# Patient Record
Sex: Male | Born: 1965
Health system: Southern US, Community
[De-identification: ages and names within clinical notes are randomized; demographics above are authoritative.]

## PROBLEM LIST (undated history)

## (undated) DIAGNOSIS — C801 Malignant (primary) neoplasm, unspecified: Secondary | ICD-10-CM

## (undated) HISTORY — DX: Malignant (primary) neoplasm, unspecified: C80.1

---
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
## 2002-08-07 ENCOUNTER — Ambulatory Visit (HOSPITAL_COMMUNITY): Admission: RE | Admit: 2002-08-07 | Discharge: 2002-08-07 | Payer: Self-pay | Admitting: Neurology

## 2015-06-03 ENCOUNTER — Encounter (INDEPENDENT_AMBULATORY_CARE_PROVIDER_SITE_OTHER): Payer: Self-pay

## 2015-06-03 ENCOUNTER — Encounter: Payer: Self-pay | Admitting: Physician Assistant

## 2015-06-03 ENCOUNTER — Telehealth: Payer: Self-pay | Admitting: Family Medicine

## 2015-06-03 ENCOUNTER — Ambulatory Visit (INDEPENDENT_AMBULATORY_CARE_PROVIDER_SITE_OTHER): Payer: BLUE CROSS/BLUE SHIELD | Admitting: Physician Assistant

## 2015-06-03 VITALS — BP 130/84 | HR 92 | Temp 97.4°F | Ht 66.0 in | Wt 180.0 lb

## 2015-06-03 DIAGNOSIS — J069 Acute upper respiratory infection, unspecified: Secondary | ICD-10-CM | POA: Diagnosis not present

## 2015-06-03 MED ORDER — FLUTICASONE PROPIONATE 50 MCG/ACT NA SUSP: 2.0000 | Freq: Every day | NASAL | Status: DC

## 2015-06-03 MED ORDER — CETIRIZINE HCL 10 MG PO TABS: 10.0000 mg | ORAL_TABLET | Freq: Every day | ORAL | Status: DC

## 2015-06-03 MED ORDER — AMOXICILLIN 875 MG PO TABS: 875.0000 mg | ORAL_TABLET | Freq: Two times a day (BID) | ORAL | Status: DC

## 2015-06-03 NOTE — Progress Notes (Signed)
   Subjective:    Patient ID: Kenneth Greene, male    DOB: 03/22/1966, 49 y.o.   MRN: [id number]  HPI 49 y/o male presents with c/o headache, increased with bending forward. States that when he bends forward he smells a funny smell. He also has PND.    Review of Systems  Constitutional: Negative for fever, chills, diaphoresis and fatigue.  HENT: Positive for congestion (nasal ), postnasal drip, rhinorrhea, sinus pressure and sore throat. Negative for ear pain and sneezing.   Eyes: Negative.   Respiratory: Negative for cough, shortness of breath and wheezing.   Cardiovascular: Negative.   Gastrointestinal: Positive for nausea.  Endocrine: Negative.   Genitourinary: Negative.   Musculoskeletal: Negative.   Skin: Negative.   Allergic/Immunologic: Negative.   Neurological: Negative.        Objective:   Physical Exam  Constitutional: He is oriented to person, place, and time. He appears well-developed and well-nourished. No distress.  HENT:  Head: Normocephalic and atraumatic.  Right Ear: External ear normal.  Left Ear: External ear normal.  Mouth/Throat: No oropharyngeal exudate.  Posterior pharynx erythema bilaterally  ttp right maxillary sinus  Nasal turbinates erythematous and boggy bilateally   Pulmonary/Chest: Effort normal and breath sounds normal. No respiratory distress. He has no wheezes. He has no rales. He exhibits no tenderness.  Neurological: He is alert and oriented to person, place, and time.  Skin: He is not diaphoretic.  Psychiatric: He has a normal mood and affect. His behavior is normal. Judgment and thought content normal.  Nursing note and vitals reviewed.         Assessment & Plan:  1. Acute upper respiratory infection - Otc plain musinex - amoxicillin (AMOXIL) 875 MG tablet; Take 1 tablet (875 mg total) by mouth 2 (two) times daily.  Dispense: 20 tablet; Refill: 0 - fluticasone (FLONASE) 50 MCG/ACT nasal spray; Place 2 sprays into both nostrils  daily.  Dispense: 16 g; Refill: 6 - cetirizine (ZYRTEC) 10 MG tablet; Take 1 tablet (10 mg total) by mouth daily.  Dispense: 30 tablet; Refill: 11   RTC if s/s worsen or do not improve   Sherrill Mckamie A. Benjamin Stain PA-C

## 2015-06-03 NOTE — Telephone Encounter (Signed)
Patient has a sinus infection and has bcbs and he is not currently on any medications.  Appointment given for this evening at 6:15 and was aware to arrive at 5:45.

## 2015-06-03 NOTE — Patient Instructions (Signed)
Over the counter plain musinex     Upper Respiratory Infection, Adult An upper respiratory infection (URI) is also sometimes known as the common cold. The upper respiratory tract includes the nose, sinuses, throat, trachea, and bronchi. Bronchi are the airways leading to the lungs. Most people improve within 1 week, but symptoms can last up to 2 weeks. A residual cough may last even longer.  CAUSES Many different viruses can infect the tissues lining the upper respiratory tract. The tissues become irritated and inflamed and often become very moist. Mucus production is also common. A cold is contagious. You can easily spread the virus to others by oral contact. This includes kissing, sharing a glass, coughing, or sneezing. Touching your mouth or nose and then touching a surface, which is then touched by another person, can also spread the virus. SYMPTOMS  Symptoms typically develop 1 to 3 days after you come in contact with a cold virus. Symptoms vary from person to person. They may include:  Runny nose.  Sneezing.  Nasal congestion.  Sinus irritation.  Sore throat.  Loss of voice (laryngitis).  Cough.  Fatigue.  Muscle aches.  Loss of appetite.  Headache.  Low-grade fever. DIAGNOSIS  You might diagnose your own cold based on familiar symptoms, since most people get a cold 2 to 3 times a year. Your caregiver can confirm this based on your exam. Most importantly, your caregiver can check that your symptoms are not due to another disease such as strep throat, sinusitis, pneumonia, asthma, or epiglottitis. Blood tests, throat tests, and X-rays are not necessary to diagnose a common cold, but they may sometimes be helpful in excluding other more serious diseases. Your caregiver will decide if any further tests are required. RISKS AND COMPLICATIONS  You may be at risk for a more severe case of the common cold if you smoke cigarettes, have chronic heart disease (such as heart failure)  or lung disease (such as asthma), or if you have a weakened immune system. The very young and very old are also at risk for more serious infections. Bacterial sinusitis, middle ear infections, and bacterial pneumonia can complicate the common cold. The common cold can worsen asthma and chronic obstructive pulmonary disease (COPD). Sometimes, these complications can require emergency medical care and may be life-threatening. PREVENTION  The best way to protect against getting a cold is to practice good hygiene. Avoid oral or hand contact with people with cold symptoms. Wash your hands often if contact occurs. There is no clear evidence that vitamin C, vitamin E, echinacea, or exercise reduces the chance of developing a cold. However, it is always recommended to get plenty of rest and practice good nutrition. TREATMENT  Treatment is directed at relieving symptoms. There is no cure. Antibiotics are not effective, because the infection is caused by a virus, not by bacteria. Treatment may include:  Increased fluid intake. Sports drinks offer valuable electrolytes, sugars, and fluids.  Breathing heated mist or steam (vaporizer or shower).  Eating chicken soup or other clear broths, and maintaining good nutrition.  Getting plenty of rest.  Using gargles or lozenges for comfort.  Controlling fevers with ibuprofen or acetaminophen as directed by your caregiver.  Increasing usage of your inhaler if you have asthma. Zinc gel and zinc lozenges, taken in the first 24 hours of the common cold, can shorten the duration and lessen the severity of symptoms. Pain medicines may help with fever, muscle aches, and throat pain. A variety of non-prescription medicines are  available to treat congestion and runny nose. Your caregiver can make recommendations and may suggest nasal or lung inhalers for other symptoms.  HOME CARE INSTRUCTIONS   Only take over-the-counter or prescription medicines for pain, discomfort, or  fever as directed by your caregiver.  Use a warm mist humidifier or inhale steam from a shower to increase air moisture. This may keep secretions moist and make it easier to breathe.  Drink enough water and fluids to keep your urine clear or pale yellow.  Rest as needed.  Return to work when your temperature has returned to normal or as your caregiver advises. You may need to stay home longer to avoid infecting others. You can also use a face mask and careful hand washing to prevent spread of the virus. SEEK MEDICAL CARE IF:   After the first few days, you feel you are getting worse rather than better.  You need your caregiver's advice about medicines to control symptoms.  You develop chills, worsening shortness of breath, or brown or red sputum. These may be signs of pneumonia.  You develop yellow or brown nasal discharge or pain in the face, especially when you bend forward. These may be signs of sinusitis.  You develop a fever, swollen neck glands, pain with swallowing, or white areas in the back of your throat. These may be signs of strep throat. SEEK IMMEDIATE MEDICAL CARE IF:   You have a fever.  You develop severe or persistent headache, ear pain, sinus pain, or chest pain.  You develop wheezing, a prolonged cough, cough up blood, or have a change in your usual mucus (if you have chronic lung disease).  You develop sore muscles or a stiff neck. Document Released: 04/20/2001 Document Revised: 01/17/2012 Document Reviewed: 01/30/2014 [facility] Patient Information 2015 [place], [place]. This information is not intended to replace advice given to you by your health care provider. Make sure you discuss any questions you have with your health care provider.

## 2015-09-23 ENCOUNTER — Encounter: Payer: Self-pay | Admitting: Family Medicine

## 2015-09-23 ENCOUNTER — Ambulatory Visit (INDEPENDENT_AMBULATORY_CARE_PROVIDER_SITE_OTHER): Payer: BLUE CROSS/BLUE SHIELD | Admitting: Family Medicine

## 2015-09-23 VITALS — BP 128/80 | HR 88 | Temp 97.3°F | Ht 66.0 in | Wt 187.0 lb

## 2015-09-23 DIAGNOSIS — N521 Erectile dysfunction due to diseases classified elsewhere: Secondary | ICD-10-CM | POA: Diagnosis not present

## 2015-09-23 DIAGNOSIS — Z720 Tobacco use: Secondary | ICD-10-CM | POA: Diagnosis not present

## 2015-09-23 DIAGNOSIS — L03211 Cellulitis of face: Secondary | ICD-10-CM | POA: Diagnosis not present

## 2015-09-23 DIAGNOSIS — Z8547 Personal history of malignant neoplasm of testis: Secondary | ICD-10-CM | POA: Diagnosis not present

## 2015-09-23 MED ORDER — TADALAFIL 20 MG PO TABS: 10.0000 mg | ORAL_TABLET | ORAL | Status: DC | PRN

## 2015-09-23 MED ORDER — AMOXICILLIN-POT CLAVULANATE 875-125 MG PO TABS: 1.0000 | ORAL_TABLET | Freq: Two times a day (BID) | ORAL | Status: DC

## 2015-09-23 NOTE — Progress Notes (Signed)
Subjective:  Patient ID: Kenneth Greene, male    DOB: [birth year]  Age: 49 y.o. MRN: [id number]  CC: Facial Swelling   HPI Kenneth Greene presents for 3 days of left facial pain. he has had swelling as well. The pain is moderate. It is more painful to chew than usual. No fever chills or sweats. It has been consistently getting worse.  Patient also is concerned about erectile dysfunction. He has been taking Levitra in the past. He is interested in trying Cialis. He had testicular cancer at age 12. He's been cancer free for 30 some years. He lost a testicle surgically but he did not have to go through chemotherapy or radiation. He was unable to father children however. Sex drive is moderate. However he is a smoker and ingest he says that he'd rather give up sex than his cigarettes. In response I told him that he might get his wish.  History Kenneth Greene has a past medical history of Cancer ([place]).   He has past surgical history that includes Orchiectomy (N/A, 1983).   His family history includes Diabetes in his mother; Heart disease in his father.He reports that he has been smoking.  He does not have any smokeless tobacco history on file. He reports that he does not drink alcohol or use illicit drugs.  Current Outpatient Prescriptions on File Prior to Visit  Medication Sig Dispense Refill  . cetirizine (ZYRTEC) 10 MG tablet Take 1 tablet (10 mg total) by mouth daily. (Patient not taking: Reported on 09/23/2015) 30 tablet 11  . fluticasone (FLONASE) 50 MCG/ACT nasal spray Place 2 sprays into both nostrils daily. (Patient not taking: Reported on 09/23/2015) 16 g 6   No current facility-administered medications on file prior to visit.    ROS Review of Systems  Constitutional: Negative for fever, chills, activity change and appetite change.  HENT: Positive for congestion and sinus pressure. Negative for ear discharge, ear pain, hearing loss, nosebleeds, postnasal drip, rhinorrhea (:Left  maxillary region), sneezing and trouble swallowing.   Respiratory: Negative for chest tightness and shortness of breath.   Cardiovascular: Negative for chest pain and palpitations.  Genitourinary: Negative for dysuria, discharge, scrotal swelling, difficulty urinating and penile pain.  Skin: Negative for rash.    Objective:  BP 128/80 mmHg  Pulse 88  Temp(Src) 97.3 F (36.3 C) (Oral)  Ht 5\' 6"  (1.676 m)  Wt 187 lb (84.823 kg)  BMI 30.20 kg/m2  SpO2 97%  Physical Exam  Constitutional: He is oriented to person, place, and time. He appears well-developed and well-nourished.  HENT:  Head: Normocephalic and atraumatic.  Right Ear: Tympanic membrane and external ear normal. No decreased hearing is noted.  Left Ear: Tympanic membrane and external ear normal. No decreased hearing is noted.  Nose: Mucosal edema present. Right sinus exhibits no frontal sinus tenderness. Left sinus exhibits no frontal sinus tenderness.  Mouth/Throat: No oropharyngeal exudate or posterior oropharyngeal erythema.  Moderate left facial edema over the cheek and to the angle of the mandible. Exam of the oral mucosa shows a 3 mm ulcer with moderate edema above the left upper bicuspid and first molar. There is also a large amount of erythema extending over to the pupil mucosa  Neck: No Brudzinski's sign noted.  Pulmonary/Chest: Breath sounds normal. No respiratory distress.  Musculoskeletal: Normal range of motion.  Lymphadenopathy:       Head (right side): No preauricular adenopathy present.       Head (left side): No preauricular adenopathy  present.       Right cervical: No superficial cervical adenopathy present.      Left cervical: No superficial cervical adenopathy present.  Neurological: He is alert and oriented to person, place, and time.    Assessment & Plan:   Kenneth Greene was seen today for facial swelling.  Diagnoses and all orders for this visit:  Facial cellulitis  Erectile dysfunction due to  diseases classified elsewhere  Tobacco abuse  History of primary testicular cancer  Other orders -     tadalafil (CIALIS) 20 MG tablet; Take 0.5-1 tablets (10-20 mg total) by mouth every other day as needed for erectile dysfunction. -     amoxicillin-clavulanate (AUGMENTIN) 875-125 MG tablet; Take 1 tablet by mouth 2 (two) times daily. Take all of this medication   I have discontinued Kenneth Greene's amoxicillin. I am also having him start on tadalafil and amoxicillin-clavulanate. Additionally, I am having him maintain his fluticasone and cetirizine.  Meds ordered this encounter  Medications  . tadalafil (CIALIS) 20 MG tablet    Sig: Take 0.5-1 tablets (10-20 mg total) by mouth every other day as needed for erectile dysfunction.    Dispense:  5 tablet    Refill:  11  . amoxicillin-clavulanate (AUGMENTIN) 875-125 MG tablet    Sig: Take 1 tablet by mouth 2 (two) times daily. Take all of this medication    Dispense:  20 tablet    Refill:  0   Smoking cessation discussion lasting approximately 10 minutes. Also discussed itspotential relation to  erectile dysfunction  Follow-up: Return in about 2 weeks (around 10/07/2015) for CPE.  Claretta Fraise, M.D.

## 2015-10-07 ENCOUNTER — Encounter: Payer: Self-pay | Admitting: Family Medicine

## 2015-10-07 ENCOUNTER — Ambulatory Visit (INDEPENDENT_AMBULATORY_CARE_PROVIDER_SITE_OTHER): Payer: BLUE CROSS/BLUE SHIELD

## 2015-10-07 ENCOUNTER — Ambulatory Visit (INDEPENDENT_AMBULATORY_CARE_PROVIDER_SITE_OTHER): Payer: BLUE CROSS/BLUE SHIELD | Admitting: Family Medicine

## 2015-10-07 VITALS — BP 129/80 | HR 95 | Temp 97.0°F | Ht 66.0 in | Wt 189.0 lb

## 2015-10-07 DIAGNOSIS — R05 Cough: Secondary | ICD-10-CM | POA: Diagnosis not present

## 2015-10-07 DIAGNOSIS — Z0000 Encounter for general adult medical examination without abnormal findings: Secondary | ICD-10-CM | POA: Diagnosis not present

## 2015-10-07 DIAGNOSIS — Z720 Tobacco use: Secondary | ICD-10-CM

## 2015-10-07 DIAGNOSIS — Z125 Encounter for screening for malignant neoplasm of prostate: Secondary | ICD-10-CM

## 2015-10-07 DIAGNOSIS — Z0189 Encounter for other specified special examinations: Secondary | ICD-10-CM | POA: Diagnosis not present

## 2015-10-07 DIAGNOSIS — R053 Chronic cough: Secondary | ICD-10-CM

## 2015-10-07 NOTE — Patient Instructions (Signed)

## 2015-10-07 NOTE — Progress Notes (Signed)
Quick Note:  Your chest x-ray looked normal. Thanks, WS. ______ 

## 2015-10-07 NOTE — Progress Notes (Signed)
Subjective:  Patient ID: Kenneth Greene, male    DOB: 1966-03-01  Age: 49 y.o. MRN: [id number]  CC: Annual Exam   HPI SHAWNEE PETZOLD presents for CPE. Smoking 1 per day cigarettes for 33 years.  Cough onset 1 year.  Not exercising  History Kenneth Greene has a past medical history of Cancer ([place]).   Kenneth Greene has past surgical history that includes Orchiectomy (N/A, 1983).   His family history includes Diabetes in his mother; Heart disease in his father.Kenneth Greene reports that Kenneth Greene has been smoking.  Kenneth Greene does not have any smokeless tobacco history on file. Kenneth Greene reports that Kenneth Greene does not drink alcohol or use illicit drugs.  Outpatient Prescriptions Prior to Visit  Medication Sig Dispense Refill  . amoxicillin-clavulanate (AUGMENTIN) 875-125 MG tablet Take 1 tablet by mouth 2 (two) times daily. Take all of this medication 20 tablet 0  . cetirizine (ZYRTEC) 10 MG tablet Take 1 tablet (10 mg total) by mouth daily. (Patient not taking: Reported on 09/23/2015) 30 tablet 11  . fluticasone (FLONASE) 50 MCG/ACT nasal spray Place 2 sprays into both nostrils daily. (Patient not taking: Reported on 09/23/2015) 16 g 6  . tadalafil (CIALIS) 20 MG tablet Take 0.5-1 tablets (10-20 mg total) by mouth every other day as needed for erectile dysfunction. 5 tablet 11   No facility-administered medications prior to visit.    ROS Review of Systems  Constitutional: Negative for fever, chills, diaphoresis, activity change, appetite change, fatigue and unexpected weight change.  HENT: Negative for congestion, ear pain, hearing loss, postnasal drip, rhinorrhea, sore throat, tinnitus and trouble swallowing.   Eyes: Negative for photophobia, pain, discharge and redness.  Respiratory: Positive for cough. Negative for apnea, choking, chest tightness, shortness of breath, wheezing and stridor.   Cardiovascular: Negative for chest pain, palpitations and leg swelling.  Gastrointestinal: Negative for nausea, vomiting, abdominal pain,  diarrhea, constipation, blood in stool and abdominal distention.  Endocrine: Negative for cold intolerance, heat intolerance, polydipsia, polyphagia and polyuria.  Genitourinary: Negative for dysuria, urgency, frequency, hematuria, flank pain, enuresis, difficulty urinating and genital sores.  Musculoskeletal: Negative for joint swelling and arthralgias.  Skin: Negative for color change, rash and wound.  Allergic/Immunologic: Negative for immunocompromised state.  Neurological: Negative for dizziness, tremors, seizures, syncope, facial asymmetry, speech difficulty, weakness, light-headedness, numbness and headaches.  Hematological: Does not bruise/bleed easily.  Psychiatric/Behavioral: Negative for suicidal ideas, hallucinations, behavioral problems, confusion, sleep disturbance, dysphoric mood, decreased concentration and agitation. The patient is not nervous/anxious and is not hyperactive.     Objective:  BP 129/80 mmHg  Pulse 95  Temp(Src) 97 F (36.1 C) (Oral)  Ht 5\' 6"  (1.676 m)  Wt 189 lb (85.73 kg)  BMI 30.52 kg/m2  BP Readings from Last 3 Encounters:  10/07/15 129/80  09/23/15 128/80  06/03/15 130/84    Wt Readings from Last 3 Encounters:  10/07/15 189 lb (85.73 kg)  09/23/15 187 lb (84.823 kg)  06/03/15 180 lb (81.647 kg)     Physical Exam  Constitutional: Kenneth Greene is oriented to person, place, and time. Kenneth Greene appears well-developed and well-nourished.  HENT:  Head: Normocephalic and atraumatic.  Mouth/Throat: Oropharynx is clear and moist.  Eyes: EOM are normal. Pupils are equal, round, and reactive to light.  Neck: Normal range of motion. No tracheal deviation present. No thyromegaly present.  Cardiovascular: Normal rate, regular rhythm and normal heart sounds.  Exam reveals no gallop and no friction rub.   No murmur heard. Pulmonary/Chest: Breath sounds normal. Kenneth Greene has  no wheezes. Kenneth Greene has no rales.  Abdominal: Soft. Kenneth Greene exhibits no mass. There is no tenderness.    Genitourinary: Rectum normal, prostate normal and penis normal. No penile tenderness.  Musculoskeletal: Normal range of motion. Kenneth Greene exhibits no edema.  Neurological: Kenneth Greene is alert and oriented to person, place, and time. Kenneth Greene has normal reflexes. No cranial nerve deficit. Kenneth Greene exhibits normal muscle tone.  Skin: Skin is warm and dry.  Psychiatric: Kenneth Greene has a normal mood and affect.    No results found for: HGBA1C  No results found for: WBC, HGB, HCT, PLT, GLUCOSE, CHOL, TRIG, HDL, LDLDIRECT, LDLCALC, ALT, AST, NA, K, CL, CREATININE, BUN, CO2, TSH, PSA, INR, GLUF, HGBA1C, MICROALBUR  No results found.  Assessment & Plan:   Jaruis was seen today for annual exam.  Diagnoses and all orders for this visit:  Wellness examination  Tobacco abuse  Screening for prostate cancer  I have discontinued Mr. Kantner's fluticasone, cetirizine, tadalafil, and amoxicillin-clavulanate.  No orders of the defined types were placed in this encounter.     Follow-up: Return in about 1 year (around 10/06/2016) for CPE.  Claretta Fraise, M.D.

## 2015-10-08 LAB — CMP14+EGFR
ALBUMIN: 4.3 g/dL (ref 3.5–5.5)
ALT: 30 IU/L (ref 0–44)
AST: 17 IU/L (ref 0–40)
Albumin/Globulin Ratio: 1.8 (ref 1.1–2.5)
Alkaline Phosphatase: 94 IU/L (ref 39–117)
BUN/Creatinine Ratio: 16 (ref 9–20)
BUN: 14 mg/dL (ref 6–24)
Bilirubin Total: 0.3 mg/dL (ref 0.0–1.2)
CALCIUM: 9.3 mg/dL (ref 8.7–10.2)
CO2: 23 mmol/L (ref 18–29)
CREATININE: 0.85 mg/dL (ref 0.76–1.27)
Chloride: 102 mmol/L (ref 97–106)
GFR calc non Af Amer: 102 mL/min/{1.73_m2} (ref >59)
GFR, EST AFRICAN AMERICAN: 118 mL/min/{1.73_m2} (ref >59)
GLOBULIN, TOTAL: 2.4 g/dL (ref 1.5–4.5)
Glucose: 98 mg/dL (ref 65–99)
Potassium: 4.5 mmol/L (ref 3.5–5.2)
SODIUM: 141 mmol/L (ref 136–144)
TOTAL PROTEIN: 6.7 g/dL (ref 6.0–8.5)

## 2015-10-08 LAB — CBC WITH DIFFERENTIAL/PLATELET
BASOS: 1 %
Basophils Absolute: 0.1 10*3/uL (ref 0.0–0.2)
EOS (ABSOLUTE): 0.4 10*3/uL (ref 0.0–0.4)
EOS: 3 %
HEMATOCRIT: 45.8 % (ref 37.5–51.0)
HEMOGLOBIN: 15.9 g/dL (ref 12.6–17.7)
IMMATURE GRANULOCYTES: 0 %
Immature Grans (Abs): 0 10*3/uL (ref 0.0–0.1)
Lymphocytes Absolute: 2.6 10*3/uL (ref 0.7–3.1)
Lymphs: 20 %
MCH: 31.3 pg (ref 26.6–33.0)
MCHC: 34.7 g/dL (ref 31.5–35.7)
MCV: 90 fL (ref 79–97)
MONOCYTES: 8 %
Monocytes Absolute: 1 10*3/uL — ABNORMAL HIGH (ref 0.1–0.9)
NEUTROS PCT: 68 %
Neutrophils Absolute: 8.8 10*3/uL — ABNORMAL HIGH (ref 1.4–7.0)
Platelets: 297 10*3/uL (ref 150–379)
RBC: 5.08 x10E6/uL (ref 4.14–5.80)
RDW: 13.6 % (ref 12.3–15.4)
WBC: 12.9 10*3/uL — AB (ref 3.4–10.8)

## 2015-10-08 LAB — PSA TOTAL (REFLEX TO FREE): Prostate Specific Ag, Serum: 1.4 ng/mL (ref 0.0–4.0)

## 2015-10-08 LAB — NMR, LIPOPROFILE
CHOLESTEROL: 192 mg/dL (ref 100–199)
HDL Cholesterol by NMR: 33 mg/dL — ABNORMAL LOW (ref >39)
HDL PARTICLE NUMBER: 25.3 umol/L — AB (ref >=30.5)
LDL Particle Number: 1712 nmol/L — ABNORMAL HIGH (ref <1000)
LDL Size: 20.5 nm (ref >20.5)
LDL-C: 130 mg/dL — AB (ref 0–99)
LP-IR SCORE: 62 — AB (ref <=45)
Small LDL Particle Number: 754 nmol/L — ABNORMAL HIGH (ref <=527)
Triglycerides by NMR: 143 mg/dL (ref 0–149)

## 2015-10-08 LAB — VITAMIN D 25 HYDROXY (VIT D DEFICIENCY, FRACTURES): Vit D, 25-Hydroxy: 21 ng/mL — ABNORMAL LOW (ref 30.0–100.0)

## 2015-10-15 ENCOUNTER — Encounter: Payer: Self-pay | Admitting: Family Medicine

## 2017-08-09 ENCOUNTER — Emergency Department (HOSPITAL_COMMUNITY)
Admission: EM | Admit: 2017-08-09 | Discharge: 2017-08-09 | Disposition: A | Discharge status: Home / Self Care | Payer: BLUE CROSS/BLUE SHIELD | Attending: Emergency Medicine | Admitting: Emergency Medicine

## 2017-08-09 ENCOUNTER — Encounter (HOSPITAL_COMMUNITY): Payer: Self-pay | Admitting: *Deleted

## 2017-08-09 DIAGNOSIS — R109 Unspecified abdominal pain: Secondary | ICD-10-CM | POA: Diagnosis not present

## 2017-08-09 DIAGNOSIS — R3911 Hesitancy of micturition: Secondary | ICD-10-CM | POA: Diagnosis not present

## 2017-08-09 DIAGNOSIS — F17210 Nicotine dependence, cigarettes, uncomplicated: Secondary | ICD-10-CM | POA: Diagnosis not present

## 2017-08-09 LAB — I-STAT CHEM 8, ED
BUN: 18 mg/dL (ref 6–20)
CALCIUM ION: 1.21 mmol/L (ref 1.15–1.40)
CREATININE: 1.1 mg/dL (ref 0.61–1.24)
Chloride: 106 mmol/L (ref 101–111)
GLUCOSE: 125 mg/dL — AB (ref 65–99)
HCT: 43 % (ref 39.0–52.0)
HEMOGLOBIN: 14.6 g/dL (ref 13.0–17.0)
POTASSIUM: 3.7 mmol/L (ref 3.5–5.1)
Sodium: 143 mmol/L (ref 135–145)
TCO2: 25 mmol/L (ref 22–32)

## 2017-08-09 LAB — URINALYSIS, ROUTINE W REFLEX MICROSCOPIC
BACTERIA UA: NONE SEEN
Bilirubin Urine: NEGATIVE
GLUCOSE, UA: NEGATIVE mg/dL
KETONES UR: NEGATIVE mg/dL
LEUKOCYTES UA: NEGATIVE
NITRITE: NEGATIVE
PH: 7 (ref 5.0–8.0)
PROTEIN: NEGATIVE mg/dL
Specific Gravity, Urine: 1.013 (ref 1.005–1.030)
Squamous Epithelial / LPF: NONE SEEN

## 2017-08-09 MED ORDER — SODIUM CHLORIDE 0.9 % IV BOLUS (SEPSIS)
1000.0000 mL | Freq: Once | INTRAVENOUS | Status: AC
Start: 2017-08-09 — End: 2017-08-09
  Administered 2017-08-09: 1000 mL via INTRAVENOUS

## 2017-08-09 NOTE — ED Triage Notes (Signed)
Pt states left side flank pain radiating to the groin. on & off for a week . Pain got worse tonight.

## 2017-08-09 NOTE — ED Notes (Signed)
Pt stated he has not urinated since 1900 yesterday. Bladder scan done. appx 40 ml of urine showing in bladder.

## 2017-08-09 NOTE — ED Provider Notes (Signed)
[facility] DEPT Provider Note   CSN: [id number] Arrival date & time: 08/09/17  0320     History   Chief Complaint Chief Complaint  Patient presents with  . Flank Pain    HPI Kenneth Greene is a 51 y.o. male.  The history is provided by the patient.  Flank Pain  This is a new problem. The current episode started more than 2 days ago. The problem occurs daily. The problem has been rapidly worsening. Associated symptoms include abdominal pain. Pertinent negatives include no chest pain and no shortness of breath. Nothing aggravates the symptoms. Nothing relieves the symptoms.   Pt reports he has had left flank pain on/off for past week Tonight it became abruptly worse and radiated into groin He also reports nausea/vomiting He denies dysuria but reports decreased urine output   Past Medical History:  Diagnosis Date  . Cancer ([place])    TESTICULAR    There are no active problems to display for this patient.   Past Surgical History:  Procedure Laterality Date  . ORCHIECTOMY N/A 1983   unilateral       Home Medications    Prior to Admission medications   Not on File    Family History Family History  Problem Relation Age of Onset  . Diabetes Mother   . Heart disease Father     Social History Social History  Substance Use Topics  . Smoking status: Current Every Day Smoker    Types: Cigarettes  . Smokeless tobacco: Never Used  . Alcohol use No     Allergies   Patient has no known allergies.   Review of Systems Review of Systems  Constitutional: Negative for fever.  Respiratory: Negative for shortness of breath.   Cardiovascular: Negative for chest pain.  Gastrointestinal: Positive for abdominal pain.  Genitourinary: Positive for difficulty urinating and flank pain.  All other systems reviewed and are negative.    Physical Exam Updated Vital Signs BP (!) 143/88 (BP Location: Right Arm)   Pulse 90   Temp (!) 97.5 F (36.4 C) (Oral)    Resp 18   Ht 1.676 m (5\' 6" )   Wt 85.7 kg (189 lb)   SpO2 96%   BMI 30.51 kg/m   Physical Exam CONSTITUTIONAL: Well developed/well nourished HEAD: Normocephalic/atraumatic EYES: EOMI/PERRL ENMT: Mucous membranes moist NECK: supple no meningeal signs SPINE/BACK:entire spine nontender CV: S1/S2 noted, no murmurs/rubs/gallops noted LUNGS: Lungs are clear to auscultation bilaterally, no apparent distress ABDOMEN: soft, nontender, no rebound or guarding, bowel sounds noted throughout abdomen GU:no cva tenderness NEURO: Pt is awake/alert/appropriate, moves all extremitiesx4.  No facial droop.   EXTREMITIES: pulses normal/equal, full ROM SKIN: warm, color normal PSYCH: no abnormalities of mood noted, alert and oriented to situation   ED Treatments / Results  Labs (all labs ordered are listed, but only abnormal results are displayed) Labs Reviewed  URINALYSIS, ROUTINE W REFLEX MICROSCOPIC - Abnormal; Notable for the following:       Result Value   Hgb urine dipstick LARGE (*)    All other components within normal limits  I-STAT CHEM 8, ED - Abnormal; Notable for the following:    Glucose, Bld 125 (*)    All other components within normal limits    EKG  EKG Interpretation None       Radiology No results found.  Procedures Procedures (including critical care time)  Medications Ordered in ED Medications  sodium chloride 0.9 % bolus 1,000 mL (1,000 mLs Intravenous New Bag/Given  08/09/17 0359)     Initial Impression / Assessment and Plan / ED Course  I have reviewed the triage vital signs and the nursing notes.  Pertinent labs results that were available during my care of the patient were reviewed by me and considered in my medical decision making (see chart for details).    4:39 AM By the end of my evaluation, pt was improved He declined pain meds Labs pending at this time 5:48 AM Pt reports he is pain free No distress He is ambulatory Suspect this represents  passed ureteral stone given h/o flank pain and hematuria Will d/c home Will defer further imaging/workup at this time  We discussed strict ER return precautions   Final Clinical Impressions(s) / ED Diagnoses   Final diagnoses:  Flank pain    New Prescriptions New Prescriptions   No medications on file     Ripley Fraise, MD 08/09/17 [phone]

## 2017-08-09 NOTE — ED Notes (Signed)
Pt alert & oriented x4, stable gait. Patient given discharge instructions, paperwork & prescription(s). Patient  instructed to stop at the registration desk to finish any additional paperwork. Patient verbalized understanding. Pt left department w/ no further questions. 

## 2019-11-12 ENCOUNTER — Encounter: Payer: Self-pay | Admitting: Nurse Practitioner

## 2019-11-12 ENCOUNTER — Ambulatory Visit (INDEPENDENT_AMBULATORY_CARE_PROVIDER_SITE_OTHER): Payer: BC Managed Care – PPO | Admitting: Nurse Practitioner

## 2019-11-12 DIAGNOSIS — J0100 Acute maxillary sinusitis, unspecified: Secondary | ICD-10-CM

## 2019-11-12 MED ORDER — AMOXICILLIN-POT CLAVULANATE 875-125 MG PO TABS: 1.0000 | ORAL_TABLET | Freq: Two times a day (BID) | ORAL | 0 refills | Status: DC

## 2019-11-12 NOTE — Progress Notes (Signed)
Virtual Visit via telephone Note Due to COVID-19 pandemic this visit was conducted virtually. This visit type was conducted due to national recommendations for restrictions regarding the COVID-19 Pandemic (e.g. social distancing, sheltering in place) in an effort to limit this patient's exposure and mitigate transmission in our community. All issues noted in this document were discussed and addressed.  A physical exam was not performed with this format.  I connected with Kenneth Greene on 11/12/19 at 10:15 by telephone and verified that I am speaking with the correct person using two identifiers. Kenneth Greene is currently located at home and no one is currently with him during visit. The provider, Mary-Margaret Hassell Done, FNP is located in their office at time of visit.  I discussed the limitations, risks, security and privacy concerns of performing an evaluation and management service by telephone and the availability of in person appointments. I also discussed with the patient that there may be a patient responsible charge related to this service. The patient expressed understanding and agreed to proceed.   History and Present Illness:   Chief Complaint: Sinusitis   HPI Patient calls in today c/o running a fever since christmas. He has had congestion, headache and body aches. He is some better today. Slight congestion, temp last night was 101.  He was tested for covid at [facility] in [facility] and test results were negative.   Review of Systems  Constitutional: Negative for chills and fever.  HENT: Positive for congestion and sinus pain. Negative for sore throat.   Respiratory: Positive for cough (non productive).   Musculoskeletal: Negative.   Neurological: Positive for headaches.  Psychiatric/Behavioral: Negative.   All other systems reviewed and are negative.    Observations/Objective: Alert and oriented- answers all questions appropriately No distress Slight  cough  Assessment and Plan: Kenneth Greene in today with chief complaint of Sinusitis   1. Acute maxillary sinusitis, recurrence not specified 1. Take meds as prescribed 2. Use a cool mist humidifier especially during the winter months and when heat has been humid. 3. Use saline nose sprays frequently 4. Saline irrigations of the nose can be very helpful if done frequently.  * 4X daily for 1 week*  * Use of a nettie pot can be helpful with this. Follow directions with this* 5. Drink plenty of fluids 6. Keep thermostat turn down low 7.For any cough or congestion  Use plain Mucinex- regular strength or max strength is fine   * Children- consult with Pharmacist for dosing 8. For fever or aces or pains- take tylenol or ibuprofen appropriate for age and weight.  * for fevers greater than 101 orally you may alternate ibuprofen and tylenol every  3 hours.   Meds ordered this encounter  Medications  . amoxicillin-clavulanate (AUGMENTIN) 875-125 MG tablet    Sig: Take 1 tablet by mouth 2 (two) times daily.    Dispense:  14 tablet    Refill:  0    Order Specific Question:   Supervising Provider    Answer:   Caryl Pina A [id number]      Follow Up Instructions: prn    I discussed the assessment and treatment plan with the patient. The patient was provided an opportunity to ask questions and all were answered. The patient agreed with the plan and demonstrated an understanding of the instructions.   The patient was advised to call back or seek an in-person evaluation if the symptoms worsen or if the condition fails to improve  as anticipated.  The above assessment and management plan was discussed with the patient. The patient verbalized understanding of and has agreed to the management plan. Patient is aware to call the clinic if symptoms persist or worsen. Patient is aware when to return to the clinic for a follow-up visit. Patient educated on when it is appropriate to go to the  emergency department.   Time call ended:  10:27  I provided 12 minutes of non-face-to-face time during this encounter.    Mary-Margaret Hassell Done, FNP

## 2019-11-16 ENCOUNTER — Ambulatory Visit: Payer: BC Managed Care – PPO | Attending: Internal Medicine

## 2019-11-16 ENCOUNTER — Other Ambulatory Visit: Payer: Self-pay

## 2019-11-16 DIAGNOSIS — Z20822 Contact with and (suspected) exposure to covid-19: Secondary | ICD-10-CM | POA: Diagnosis not present

## 2019-11-17 LAB — NOVEL CORONAVIRUS, NAA: SARS-CoV-2, NAA: NOT DETECTED

## 2020-04-23 ENCOUNTER — Encounter: Payer: Self-pay | Admitting: Physician Assistant

## 2020-04-23 ENCOUNTER — Ambulatory Visit: Payer: BC Managed Care – PPO | Admitting: Physician Assistant

## 2020-04-23 ENCOUNTER — Other Ambulatory Visit: Payer: Self-pay

## 2020-04-23 VITALS — BP 130/86 | HR 94 | Temp 98.1°F | Resp 20 | Ht 66.0 in | Wt 180.2 lb

## 2020-04-23 DIAGNOSIS — F419 Anxiety disorder, unspecified: Secondary | ICD-10-CM | POA: Diagnosis not present

## 2020-04-23 MED ORDER — SERTRALINE HCL 50 MG PO TABS: 50.0000 mg | ORAL_TABLET | Freq: Every day | ORAL | 0 refills | Status: DC

## 2020-04-23 NOTE — Progress Notes (Signed)
  Subjective:     Patient ID: Kenneth Greene, male   DOB: 05/28/1966, 54 y.o.   MRN: [id number]  HPI Pt with hx of anxiety/panic attacks Sx started several years ago following the loss of his mother He noticed he became more anxious and having mood swing with crying spells Then over the last several months he has had increasing trouble sleeping due to mind racing This has been accompanied by panic attacks with will happen intermit with up to 2-3 per day Sx sl worse since son left to go to [place] No FH of known anxiety/depression No prev tx  Review of Systems  Constitutional: Negative.   Psychiatric/Behavioral: Positive for decreased concentration and sleep disturbance. Negative for agitation, behavioral problems, confusion, dysphoric mood, hallucinations, self-injury and suicidal ideas. The patient is nervous/anxious. The patient is not hyperactive.        Objective:   Physical Exam Vitals and nursing note reviewed.  Psychiatric:        Attention and Perception: Attention normal.        Mood and Affect: Mood is anxious. Affect is tearful.        Speech: Speech normal.        Behavior: Behavior is cooperative.        Thought Content: Thought content normal.        Judgment: Judgment normal.        Assessment:     1. Anxiety        Plan:     Discussed sx with pt Reviewed good sleep habits Start Zoloft 50mg  q d- 1/2 tab for the first 8 days All SE reviewed Pt aware med will take time to work Pt with good support structure at home with wife F/U in 3 weeks sooner if any problems

## 2020-04-23 NOTE — Patient Instructions (Signed)

## 2020-05-20 ENCOUNTER — Ambulatory Visit: Payer: BC Managed Care – PPO | Admitting: Nurse Practitioner

## 2020-05-20 ENCOUNTER — Other Ambulatory Visit: Payer: Self-pay

## 2020-05-20 ENCOUNTER — Encounter: Payer: Self-pay | Admitting: Nurse Practitioner

## 2020-05-20 VITALS — BP 142/91 | HR 75 | Temp 98.6°F | Resp 20 | Ht 66.0 in | Wt 181.0 lb

## 2020-05-20 DIAGNOSIS — F5101 Primary insomnia: Secondary | ICD-10-CM

## 2020-05-20 DIAGNOSIS — F419 Anxiety disorder, unspecified: Secondary | ICD-10-CM

## 2020-05-20 MED ORDER — SERTRALINE HCL 100 MG PO TABS: 100.0000 mg | ORAL_TABLET | Freq: Every day | ORAL | 5 refills | Status: DC

## 2020-05-20 MED ORDER — TRAZODONE HCL 50 MG PO TABS: 25.0000 mg | ORAL_TABLET | Freq: Every evening | ORAL | 3 refills | Status: DC | PRN

## 2020-05-20 NOTE — Patient Instructions (Signed)

## 2020-05-20 NOTE — Progress Notes (Signed)
   Subjective:    Patient ID: Kenneth Greene, male    DOB: [birth year], 54 y.o.   MRN: [id number]   Chief Complaint: Recheck anxiety   HPI Patient was seen by B. [place] , [place] on 04/23/20 c/o anxiety. At that time he was having trouble sleeping and having panic attacks. He was started on zoloft 50mg . He says that has helped some with his panic attacks and anxiety. He says he is still easily irritated. He is still having trouble sleeping at night. Once he falls asleep he is okay. GAD 7 : Generalized Anxiety Score 05/20/2020 04/23/2020  Nervous, Anxious, on Edge 1 1  Control/stop worrying 1 1  Worry too much - different things 1 1  Trouble relaxing 1 3  Restless 0 0  Easily annoyed or irritable 1 2  Afraid - awful might happen 0 2  Total GAD 7 Score 5 10  Anxiety Difficulty Not difficult at all Extremely difficult        Review of Systems  Constitutional: Negative for diaphoresis.  Eyes: Negative for pain.  Respiratory: Negative for shortness of breath.   Cardiovascular: Negative for chest pain, palpitations and leg swelling.  Gastrointestinal: Negative for abdominal pain.  Endocrine: Negative for polydipsia.  Skin: Negative for rash.  Neurological: Negative for dizziness, weakness and headaches.  Hematological: Does not bruise/bleed easily.  All other systems reviewed and are negative.      Objective:   Physical Exam Vitals and nursing note reviewed.  Constitutional:      Appearance: Normal appearance.  Cardiovascular:     Rate and Rhythm: Normal rate and regular rhythm.     Heart sounds: Normal heart sounds.  Pulmonary:     Breath sounds: Normal breath sounds.  Skin:    General: Skin is warm.  Neurological:     General: No focal deficit present.     Mental Status: He is alert and oriented to person, place, and time.  Psychiatric:        Mood and Affect: Mood normal.        Behavior: Behavior normal.    BP (!) 142/91   Pulse 75   Temp 98.6 F (37 C)  (Temporal)   Resp 20   Ht 5\' 6"  (1.676 m)   Wt 181 lb (82.1 kg)   SpO2 94%   BMI 29.21 kg/m        Assessment & Plan:  [name] in today with chief complaint of Recheck anxiety   1. Anxiety Increased zoloft to 100mg  daily Side effects reviewed - sertraline (ZOLOFT) 100 MG tablet; Take 1 tablet (100 mg total) by mouth daily.  Dispense: 30 tablet; Refill: 5  2. Primary insomnia Bedtime routine Added trazadone- take 1 hour prior to bedtime. - traZODone (DESYREL) 50 MG tablet; Take 0.5-1 tablets (25-50 mg total) by mouth at bedtime as needed for sleep.  Dispense: 30 tablet; Refill: 3  Schedule CPE  The above assessment and management plan was discussed with the patient. The patient verbalized understanding of and has agreed to the management plan. Patient is aware to call the clinic if symptoms persist or worsen. Patient is aware when to return to the clinic for a follow-up visit. Patient educated on when it is appropriate to go to the emergency department.   Mary-Margaret Hassell Done, FNP

## 2020-06-17 ENCOUNTER — Ambulatory Visit: Payer: Self-pay | Admitting: Nurse Practitioner

## 2020-11-08 HISTORY — PX: OTHER SURGICAL HISTORY: SHX169

## 2021-01-19 DIAGNOSIS — W268XXA Contact with other sharp object(s), not elsewhere classified, initial encounter: Secondary | ICD-10-CM | POA: Diagnosis not present

## 2021-01-19 DIAGNOSIS — S56322A Laceration of extensor or abductor muscles, fascia and tendons of left thumb at forearm level, initial encounter: Secondary | ICD-10-CM | POA: Diagnosis not present

## 2021-01-19 DIAGNOSIS — Y990 Civilian activity done for income or pay: Secondary | ICD-10-CM | POA: Diagnosis not present

## 2021-01-19 DIAGNOSIS — S56302A Unspecified injury of extensor or abductor muscles, fascia and tendons of left thumb at forearm level, initial encounter: Secondary | ICD-10-CM | POA: Diagnosis not present

## 2021-01-19 DIAGNOSIS — F17200 Nicotine dependence, unspecified, uncomplicated: Secondary | ICD-10-CM | POA: Diagnosis not present

## 2021-01-19 DIAGNOSIS — S61512A Laceration without foreign body of left wrist, initial encounter: Secondary | ICD-10-CM | POA: Diagnosis not present

## 2021-01-21 ENCOUNTER — Ambulatory Visit (INDEPENDENT_AMBULATORY_CARE_PROVIDER_SITE_OTHER): Payer: Worker's Compensation | Admitting: Plastic Surgery

## 2021-01-21 ENCOUNTER — Other Ambulatory Visit: Payer: Self-pay

## 2021-01-21 ENCOUNTER — Encounter: Payer: Self-pay | Admitting: Plastic Surgery

## 2021-01-21 VITALS — BP 138/85 | HR 74 | Ht 66.0 in | Wt 176.0 lb

## 2021-01-21 DIAGNOSIS — S66822A Laceration of other specified muscles, fascia and tendons at wrist and hand level, left hand, initial encounter: Secondary | ICD-10-CM

## 2021-01-21 DIAGNOSIS — S61402A Unspecified open wound of left hand, initial encounter: Secondary | ICD-10-CM | POA: Diagnosis not present

## 2021-01-21 NOTE — Progress Notes (Signed)
Referring Provider Che[address] Pretty, [address],  [place] [address]   CC:  Chief Complaint  Patient presents with  . Procedure      Kenneth Greene is an 55 y.o. male.  HPI: Patient presents for an injury to his left thumb.  This occurred Monday.  He was a utility knife that accidentally slipped and cut him on the radial aspect of his left thumb near the base.  He was seen in the emergency room in [facility] and they suspected a partial injury to his APL tendon and sent to me.  No Known Allergies  Outpatient Encounter Medications as of 01/21/2021  Medication Sig  . sertraline (ZOLOFT) 100 MG tablet Take 1 tablet (100 mg total) by mouth daily. (Patient not taking: Reported on 01/21/2021)  . traZODone (DESYREL) 50 MG tablet Take 0.5-1 tablets (25-50 mg total) by mouth at bedtime as needed for sleep. (Patient not taking: Reported on 01/21/2021)   No facility-administered encounter medications on file as of 01/21/2021.     Past Medical History:  Diagnosis Date  . Cancer ([place])    TESTICULAR    Past Surgical History:  Procedure Laterality Date  . ORCHIECTOMY N/A 1983   unilateral    Family History  Problem Relation Age of Onset  . Diabetes Mother   . Heart disease Father     Social History   Social History Narrative  . Not on file     Review of Systems General: Denies fevers, chills, weight loss CV: Denies chest pain, shortness of breath, palpitations  Physical Exam Vitals with BMI 01/21/2021 05/20/2020 04/23/2020  Height 5\' 6"  5\' 6"  5\' 6"   Weight 176 lbs 181 lbs 180 lbs 4 oz  BMI 28.42 28.31 51.76  Systolic 160 737 106  Diastolic 85 91 86  Pulse 74 75 94    General:  No acute distress,  Alert and oriented, Non-Toxic, Normal speech and affect Left hand: Fingers well-perfused with normal capillary refill to palp radial pulse.  Sensation is intact throughout.  He has full range of motion.  He has an oblique laceration over the base of  his left thumb that has been stapled closed.  There is no signs of infection or wound drainage.  He specifically shows good function of his EPL and FPL.  APL is also firing with normal thumb range of motion.  Assessment/Plan Patient presents with a laceration to the base of his thumb.  Based on the picture that he took in the report I suspect a partial injury to the APL.  We discussed exploration and repair if necessary.  We discussed the risks include bleeding, infection, damage surrounding structures and need for additional procedures.  All of his questions were answered and we will plan to move forward  Surgery performed:  1.  Repair of left thumb abductor pollicis longus tendon at the level of the wrist 2.  Complex repair of left thumb laceration totaling 4 cm in length Preoperative diagnosis: Partial transection of left thumb APL tendon Postoperative diagnosis: Same Surgery in detail: Patient was identified preoperatively.  Informed symptoms obtained.  There is anesthetized 1% lidocaine with epinephrine.  The sutures and staples were removed.  The area was then prepped and draped sterilely.  I opened his incision and dissected down to the first dorsal extensor compartment.  There was a rent in the sheath and a clear partial transection of the APL tendon.  This was approximately 50% transection.  I released  the first dorsal extensor compartment sheath to free up any tethering and explored all surrounding tendons.  The extensor pollicis brevis appeared to be intact.  The APL tendon was then repaired with 2 figure-of-eight 4-0 nylon sutures.  I took his thumb through range of motion and there was no gapping.  I then debrided the edges of the laceration to ensure an acute wound and performed a layered closure with interrupted buried 4-0 chromic sutures and interrupted 4-0 chromic mattress sutures for the skin.  A thumb spica type splint was then applied.  I will the patient follow-up with hand therapy  for a thumb spica splint.  I will plan to see him again in another 2 to 3 weeks.  I have asked him to wear the splint all the time.  Cindra Presume 01/21/2021, 1:21 PM

## 2021-01-26 DIAGNOSIS — M25642 Stiffness of left hand, not elsewhere classified: Secondary | ICD-10-CM | POA: Diagnosis not present

## 2021-01-26 DIAGNOSIS — M25542 Pain in joints of left hand: Secondary | ICD-10-CM | POA: Diagnosis not present

## 2021-01-26 DIAGNOSIS — M25632 Stiffness of left wrist, not elsewhere classified: Secondary | ICD-10-CM | POA: Diagnosis not present

## 2021-01-26 DIAGNOSIS — M25532 Pain in left wrist: Secondary | ICD-10-CM | POA: Diagnosis not present

## 2021-02-10 DIAGNOSIS — M25532 Pain in left wrist: Secondary | ICD-10-CM | POA: Diagnosis not present

## 2021-02-10 DIAGNOSIS — M25542 Pain in joints of left hand: Secondary | ICD-10-CM | POA: Diagnosis not present

## 2021-02-10 DIAGNOSIS — M25632 Stiffness of left wrist, not elsewhere classified: Secondary | ICD-10-CM | POA: Diagnosis not present

## 2021-02-10 DIAGNOSIS — M25642 Stiffness of left hand, not elsewhere classified: Secondary | ICD-10-CM | POA: Diagnosis not present

## 2021-02-18 ENCOUNTER — Ambulatory Visit (INDEPENDENT_AMBULATORY_CARE_PROVIDER_SITE_OTHER): Payer: Self-pay | Admitting: Plastic Surgery

## 2021-02-18 ENCOUNTER — Other Ambulatory Visit: Payer: Self-pay

## 2021-02-18 ENCOUNTER — Encounter: Payer: Self-pay | Admitting: Plastic Surgery

## 2021-02-18 VITALS — BP 163/100 | HR 116

## 2021-02-18 DIAGNOSIS — S61402A Unspecified open wound of left hand, initial encounter: Secondary | ICD-10-CM

## 2021-02-18 DIAGNOSIS — S66822A Laceration of other specified muscles, fascia and tendons at wrist and hand level, left hand, initial encounter: Secondary | ICD-10-CM

## 2021-02-18 NOTE — Progress Notes (Signed)
Patient presents about a month out from repair of a partial injury to the left thumb abductor pollicis longus at the level of the wrist.  This was about half lacerated from sharp injury and was repaired primarily.  He has been wearing the splint most of the time but continuing to work.  He feels like his function is excellent.  His only complaint is faint paresthesia on the dorsal aspect of his thumb but otherwise he feels great.  On exam there is good capillary refill and sensation volarly with a little paresthesia dorsally on the thumb.  He has full range of motion.  He does have a splint with him.  I have asked him to protect the repair with the splint for another couple weeks and at that point he can discard the splint entirely.  He is very happy with this.  I will plan to schedule an appointment with him in around 6 weeks which she can cancel leading up to it if he feels like everything is to his satisfaction.  All of his questions were answered.

## 2021-02-27 DIAGNOSIS — M25542 Pain in joints of left hand: Secondary | ICD-10-CM | POA: Diagnosis not present

## 2021-02-27 DIAGNOSIS — M25632 Stiffness of left wrist, not elsewhere classified: Secondary | ICD-10-CM | POA: Diagnosis not present

## 2021-02-27 DIAGNOSIS — M25532 Pain in left wrist: Secondary | ICD-10-CM | POA: Diagnosis not present

## 2021-02-27 DIAGNOSIS — M25642 Stiffness of left hand, not elsewhere classified: Secondary | ICD-10-CM | POA: Diagnosis not present

## 2021-04-02 ENCOUNTER — Ambulatory Visit: Payer: Self-pay | Admitting: Plastic Surgery

## 2021-10-29 ENCOUNTER — Ambulatory Visit (INDEPENDENT_AMBULATORY_CARE_PROVIDER_SITE_OTHER): Payer: BC Managed Care – PPO

## 2021-10-29 ENCOUNTER — Encounter: Payer: Self-pay | Admitting: Nurse Practitioner

## 2021-10-29 ENCOUNTER — Ambulatory Visit: Payer: BC Managed Care – PPO | Admitting: Nurse Practitioner

## 2021-10-29 VITALS — BP 154/82 | HR 74 | Temp 97.7°F | Resp 20 | Ht 66.0 in | Wt 168.0 lb

## 2021-10-29 DIAGNOSIS — F17210 Nicotine dependence, cigarettes, uncomplicated: Secondary | ICD-10-CM | POA: Diagnosis not present

## 2021-10-29 DIAGNOSIS — F17200 Nicotine dependence, unspecified, uncomplicated: Secondary | ICD-10-CM | POA: Diagnosis not present

## 2021-10-29 DIAGNOSIS — I10 Essential (primary) hypertension: Secondary | ICD-10-CM | POA: Diagnosis not present

## 2021-10-29 DIAGNOSIS — Z8547 Personal history of malignant neoplasm of testis: Secondary | ICD-10-CM

## 2021-10-29 DIAGNOSIS — Z23 Encounter for immunization: Secondary | ICD-10-CM | POA: Diagnosis not present

## 2021-10-29 DIAGNOSIS — Z125 Encounter for screening for malignant neoplasm of prostate: Secondary | ICD-10-CM | POA: Diagnosis not present

## 2021-10-29 MED ORDER — LISINOPRIL 20 MG PO TABS: 20.0000 mg | ORAL_TABLET | Freq: Every day | ORAL | 3 refills | Status: DC

## 2021-10-29 NOTE — Patient Instructions (Signed)
Managing the Challenge of Quitting Smoking ?Quitting smoking is a physical and mental challenge. You will face cravings, withdrawal symptoms, and temptation. Before quitting, work with your health care provider to make a plan that can help you manage quitting. Preparation can help you quit and keep you from giving in. ?How to manage lifestyle changes ?Managing stress ?Stress can make you want to smoke, and wanting to smoke may cause stress. It is important to find ways to manage your stress. You might try some of the following: ?Practice relaxation techniques. ?Breathe slowly and deeply, in through your nose and out through your mouth. ?Listen to music. ?Soak in a bath or take a shower. ?Imagine a peaceful place or vacation. ?Get some support. ?Talk with family or friends about your stress. ?Join a support group. ?Talk with a counselor or therapist. ?Get some physical activity. ?Go for a walk, run, or bike ride. ?Play a favorite sport. ?Practice yoga. ? ?Medicines ?Talk with your health care provider about medicines that might help you deal with cravings and make quitting easier for you. ?Relationships ?Social situations can be difficult when you are quitting smoking. To manage this, you can: ?Avoid parties and other social situations where people might be smoking. ?Avoid alcohol. ?Leave right away if you have the urge to smoke. ?Explain to your family and friends that you are quitting smoking. Ask for support and let them know you might be a bit grumpy. ?Plan activities where smoking is not an option. ?General instructions ?Be aware that many people gain weight after they quit smoking. However, not everyone does. To keep from gaining weight, have a plan in place before you quit and stick to the plan after you quit. Your plan should include: ?Having healthy snacks. When you have a craving, it may help to: ?Eat popcorn, carrots, celery, or other cut vegetables. ?Chew sugar-free gum. ?Changing how you eat. ?Eat small  portion sizes at meals. ?Eat 4-6 small meals throughout the day instead of 1-2 large meals a day. ?Be mindful when you eat. Do not watch television or do other things that might distract you as you eat. ?Exercising regularly. ?Make time to exercise each day. If you do not have time for a long workout, do short bouts of exercise for 5-10 minutes several times a day. ?Do some form of strengthening exercise, such as weight lifting. ?Do some exercise that gets your heart beating and causes you to breathe deeply, such as walking fast, running, swimming, or biking. This is very important. ?Drinking plenty of water or other low-calorie or no-calorie drinks. Drink 6-8 glasses of water daily. ? ?How to recognize withdrawal symptoms ?Your body and mind may experience discomfort as you try to get used to not having nicotine in your system. These effects are called withdrawal symptoms. They may include: ?Feeling hungrier than normal. ?Having trouble concentrating. ?Feeling irritable or restless. ?Having trouble sleeping. ?Feeling depressed. ?Craving a cigarette. ?To manage withdrawal symptoms: ?Avoid places, people, and activities that trigger your cravings. ?Remember why you want to quit. ?Get plenty of sleep. ?Avoid coffee and other caffeinated drinks. These may worsen some of your symptoms. ?These symptoms may surprise you. But be assured that they are normal to have when quitting smoking. ?How to manage cravings ?Come up with a plan for how to deal with your cravings. The plan should include the following: ?A definition of the specific situation you want to deal with. ?An alternative action you will take. ?A clear idea for how this action   will help. ?The name of someone who might help you with this. ?Cravings usually last for 5-10 minutes. Consider taking the following actions to help you with your plan to deal with cravings: ?Keep your mouth busy. ?Chew sugar-free gum. ?Suck on hard candies or a straw. ?Brush your  teeth. ?Keep your hands and body busy. ?Change to a different activity right away. ?Squeeze or play with a ball. ?Do an activity or a hobby, such as making bead jewelry, practicing needlepoint, or working with wood. ?Mix up your normal routine. ?Take a short exercise break. Go for a quick walk or run up and down stairs. ?Focus on doing something kind or helpful for someone else. ?Call a friend or family member to talk during a craving. ?Join a support group. ?Contact a quitline. ?Where to find support ?To get help or find a support group: ?Call the National Cancer Institute's Smoking Quitline: 1-800-QUIT NOW (784-8669) ?Visit the website of the Substance Abuse and Mental Health Services Administration: www.samhsa.gov ?Text QUIT to SmokefreeTXT: 478848 ?Where to find more information ?Visit these websites to find more information on quitting smoking: ?National Cancer Institute: www.smokefree.gov ?American Lung Association: www.lung.org ?American Cancer Society: www.cancer.org ?Centers for Disease Control and Prevention: www.cdc.gov ?American Heart Association: www.heart.org ?Contact a health care provider if: ?You want to change your plan for quitting. ?The medicines you are taking are not helping. ?Your eating feels out of control or you cannot sleep. ?Get help right away if: ?You feel depressed or become very anxious. ?Summary ?Quitting smoking is a physical and mental challenge. You will face cravings, withdrawal symptoms, and temptation to smoke again. Preparation can help you as you go through these challenges. ?Try different techniques to manage stress, handle social situations, and prevent weight gain. ?You can deal with cravings by keeping your mouth busy (such as by chewing gum), keeping your hands and body busy, calling family or friends, or contacting a quitline for people who want to quit smoking. ?You can deal with withdrawal symptoms by avoiding places where people smoke, getting plenty of rest, and  avoiding drinks with caffeine. ?This information is not intended to replace advice given to you by your health care provider. Make sure you discuss any questions you have with your health care provider. ?Document Revised: 07/03/2021 Document Reviewed: 08/14/2019 ?Elsevier Patient Education ? 2022 Elsevier Inc. ? ?

## 2021-10-29 NOTE — Progress Notes (Signed)
Subjective:    Patient ID: Kenneth Greene, male    DOB: [birth year], 55 y.o.   MRN: [id number]   Chief Complaint: Medical Management of Chronic Issues (Lost 35 pounds)   HPI Patient comes in c/o weight loss. He says he had testicular cancer when he was 55 years old. He has not been following up.he has had a 30lb weight loss over the last several months even though his appetite has not changed.  He is a smoker and has smoked for over 40 years.   Review of Systems  Constitutional:  Negative for diaphoresis.  Eyes:  Negative for pain.  Respiratory:  Negative for cough and shortness of breath.   Cardiovascular:  Negative for chest pain, palpitations and leg swelling.  Gastrointestinal:  Negative for abdominal pain.  Endocrine: Negative for polydipsia.  Skin:  Negative for rash.  Neurological:  Negative for dizziness, weakness and headaches.  Hematological:  Does not bruise/bleed easily.  All other systems reviewed and are negative.     Objective:   Physical Exam Vitals and nursing note reviewed.  Constitutional:      Appearance: Normal appearance. He is well-developed.  HENT:     Head: Normocephalic.     Nose: Nose normal.     Mouth/Throat:     Mouth: Mucous membranes are moist.     Pharynx: Oropharynx is clear.  Eyes:     Pupils: Pupils are equal, round, and reactive to light.  Neck:     Thyroid: No thyroid mass or thyromegaly.     Vascular: No carotid bruit or JVD.     Trachea: Phonation normal.  Cardiovascular:     Rate and Rhythm: Normal rate and regular rhythm.  Pulmonary:     Effort: Pulmonary effort is normal. No respiratory distress.     Breath sounds: Normal breath sounds.  Abdominal:     General: Bowel sounds are normal.     Palpations: Abdomen is soft.     Tenderness: There is no abdominal tenderness.  Musculoskeletal:        General: Normal range of motion.     Cervical back: Normal range of motion and neck supple.  Lymphadenopathy:     Cervical:  No cervical adenopathy.  Skin:    General: Skin is warm and dry.  Neurological:     Mental Status: He is alert and oriented to person, place, and time.  Psychiatric:        Behavior: Behavior normal.        Thought Content: Thought content normal.        Judgment: Judgment normal.   BP (!) 151/95    Pulse 74    Temp 97.7 F (36.5 C) (Temporal)    Resp 20    Ht _0  (1.676 m)    Wt 168 lb (76.2 kg)    SpO2 98%    BMI 27.12 kg/m   Chest xray- normal-Preliminary reading by Ronnald Collum, FNP  [facility]        Assessment & Plan:  Kenneth Greene comes in today with chief complaint of Medical Management of Chronic Issues (Lost 35 pounds)   Diagnosis and orders addressed:  1. Smoker Smoking cessation encouraged - DG Chest 2 View - CT CHEST LUNG CA SCREEN LOW DOSE W/O CM; Future  2. Primary hypertension Low sodium diet Started on lisinopril - lisinopril (ZESTRIL) 20 MG tablet; Take 1 tablet (20 mg total) by mouth daily.  Dispense: 90 tablet; Refill: 3 - CBC  with Differential/Platelet - CMP14+EGFR - Lipid panel  3. Hx of testicular cancer Labs pending - PSA, total and free   Labs pending Health Maintenance reviewed Diet and exercise encouraged  Follow up plan: prn   Mary-Margaret Hassell Done, FNP

## 2021-10-30 LAB — CMP14+EGFR
ALT: 19 IU/L (ref 0–44)
AST: 25 IU/L (ref 0–40)
Albumin/Globulin Ratio: 1.8 (ref 1.2–2.2)
Albumin: 4.4 g/dL (ref 3.8–4.9)
Alkaline Phosphatase: 105 IU/L (ref 44–121)
BUN/Creatinine Ratio: 15 (ref 9–20)
BUN: 15 mg/dL (ref 6–24)
Bilirubin Total: 0.4 mg/dL (ref 0.0–1.2)
CO2: 23 mmol/L (ref 20–29)
Calcium: 9.3 mg/dL (ref 8.7–10.2)
Chloride: 104 mmol/L (ref 96–106)
Creatinine, Ser: 0.99 mg/dL (ref 0.76–1.27)
Globulin, Total: 2.4 g/dL (ref 1.5–4.5)
Glucose: 89 mg/dL (ref 70–99)
Potassium: 4.2 mmol/L (ref 3.5–5.2)
Sodium: 142 mmol/L (ref 134–144)
Total Protein: 6.8 g/dL (ref 6.0–8.5)
eGFR: 90 mL/min/{1.73_m2} (ref >59)

## 2021-10-30 LAB — CBC WITH DIFFERENTIAL/PLATELET
Basophils Absolute: 0.1 10*3/uL (ref 0.0–0.2)
Basos: 1 %
EOS (ABSOLUTE): 0.1 10*3/uL (ref 0.0–0.4)
Eos: 1 %
Hematocrit: 46.4 % (ref 37.5–51.0)
Hemoglobin: 15.7 g/dL (ref 13.0–17.7)
Immature Grans (Abs): 0.1 10*3/uL (ref 0.0–0.1)
Immature Granulocytes: 1 %
Lymphocytes Absolute: 2.8 10*3/uL (ref 0.7–3.1)
Lymphs: 21 %
MCH: 30.8 pg (ref 26.6–33.0)
MCHC: 33.8 g/dL (ref 31.5–35.7)
MCV: 91 fL (ref 79–97)
Monocytes Absolute: 1 10*3/uL — ABNORMAL HIGH (ref 0.1–0.9)
Monocytes: 8 %
Neutrophils Absolute: 9 10*3/uL — ABNORMAL HIGH (ref 1.4–7.0)
Neutrophils: 68 %
Platelets: 289 10*3/uL (ref 150–450)
RBC: 5.09 x10E6/uL (ref 4.14–5.80)
RDW: 12.9 % (ref 11.6–15.4)
WBC: 13 10*3/uL — ABNORMAL HIGH (ref 3.4–10.8)

## 2021-10-30 LAB — PSA, TOTAL AND FREE
PSA, Free Pct: 26.7 %
PSA, Free: 0.4 ng/mL
Prostate Specific Ag, Serum: 1.5 ng/mL (ref 0.0–4.0)

## 2021-10-30 LAB — LIPID PANEL
Chol/HDL Ratio: 5 ratio (ref 0.0–5.0)
Cholesterol, Total: 190 mg/dL (ref 100–199)
HDL: 38 mg/dL — ABNORMAL LOW (ref >39)
LDL Chol Calc (NIH): 131 mg/dL — ABNORMAL HIGH (ref 0–99)
Triglycerides: 113 mg/dL (ref 0–149)
VLDL Cholesterol Cal: 21 mg/dL (ref 5–40)

## 2022-01-28 ENCOUNTER — Ambulatory Visit: Payer: BC Managed Care – PPO | Admitting: Nurse Practitioner

## 2022-01-28 ENCOUNTER — Encounter: Payer: Self-pay | Admitting: Nurse Practitioner

## 2022-01-28 VITALS — BP 132/88 | HR 68 | Temp 98.2°F | Resp 20 | Ht 66.0 in | Wt 172.0 lb

## 2022-01-28 DIAGNOSIS — F17200 Nicotine dependence, unspecified, uncomplicated: Secondary | ICD-10-CM

## 2022-01-28 DIAGNOSIS — G4700 Insomnia, unspecified: Secondary | ICD-10-CM | POA: Insufficient documentation

## 2022-01-28 DIAGNOSIS — F5101 Primary insomnia: Secondary | ICD-10-CM

## 2022-01-28 DIAGNOSIS — J0101 Acute recurrent maxillary sinusitis: Secondary | ICD-10-CM | POA: Diagnosis not present

## 2022-01-28 DIAGNOSIS — I10 Essential (primary) hypertension: Secondary | ICD-10-CM

## 2022-01-28 DIAGNOSIS — F339 Major depressive disorder, recurrent, unspecified: Secondary | ICD-10-CM | POA: Diagnosis not present

## 2022-01-28 MED ORDER — TRAZODONE HCL 50 MG PO TABS: 25.0000 mg | ORAL_TABLET | Freq: Every evening | ORAL | 1 refills | Status: DC | PRN

## 2022-01-28 MED ORDER — AMOXICILLIN-POT CLAVULANATE 875-125 MG PO TABS: 1.0000 | ORAL_TABLET | Freq: Two times a day (BID) | ORAL | 0 refills | Status: DC

## 2022-01-28 MED ORDER — LISINOPRIL 20 MG PO TABS: 20.0000 mg | ORAL_TABLET | Freq: Every day | ORAL | 1 refills | Status: DC

## 2022-01-28 NOTE — Patient Instructions (Signed)
Managing the Challenge of Quitting Smoking ?Quitting smoking is a physical and mental challenge. You will face cravings, withdrawal symptoms, and temptation. Before quitting, work with your health care provider to make a plan that can help you manage quitting. Preparation can help you quit and keep you from giving in. ?How to manage lifestyle changes ?Managing stress ?Stress can make you want to smoke, and wanting to smoke may cause stress. It is important to find ways to manage your stress. You might try some of the following: ?Practice relaxation techniques. ?Breathe slowly and deeply, in through your nose and out through your mouth. ?Listen to music. ?Soak in a bath or take a shower. ?Imagine a peaceful place or vacation. ?Get some support. ?Talk with family or friends about your stress. ?Join a support group. ?Talk with a counselor or therapist. ?Get some physical activity. ?Go for a walk, run, or bike ride. ?Play a favorite sport. ?Practice yoga. ? ?Medicines ?Talk with your health care provider about medicines that might help you deal with cravings and make quitting easier for you. ?Relationships ?Social situations can be difficult when you are quitting smoking. To manage this, you can: ?Avoid parties and other social situations where people might be smoking. ?Avoid alcohol. ?Leave right away if you have the urge to smoke. ?Explain to your family and friends that you are quitting smoking. Ask for support and let them know you might be a bit grumpy. ?Plan activities where smoking is not an option. ?General instructions ?Be aware that many people gain weight after they quit smoking. However, not everyone does. To keep from gaining weight, have a plan in place before you quit and stick to the plan after you quit. Your plan should include: ?Having healthy snacks. When you have a craving, it may help to: ?Eat popcorn, carrots, celery, or other cut vegetables. ?Chew sugar-free gum. ?Changing how you eat. ?Eat small  portion sizes at meals. ?Eat 4-6 small meals throughout the day instead of 1-2 large meals a day. ?Be mindful when you eat. Do not watch television or do other things that might distract you as you eat. ?Exercising regularly. ?Make time to exercise each day. If you do not have time for a long workout, do short bouts of exercise for 5-10 minutes several times a day. ?Do some form of strengthening exercise, such as weight lifting. ?Do some exercise that gets your heart beating and causes you to breathe deeply, such as walking fast, running, swimming, or biking. This is very important. ?Drinking plenty of water or other low-calorie or no-calorie drinks. Drink 6-8 glasses of water daily. ? ?How to recognize withdrawal symptoms ?Your body and mind may experience discomfort as you try to get used to not having nicotine in your system. These effects are called withdrawal symptoms. They may include: ?Feeling hungrier than normal. ?Having trouble concentrating. ?Feeling irritable or restless. ?Having trouble sleeping. ?Feeling depressed. ?Craving a cigarette. ?To manage withdrawal symptoms: ?Avoid places, people, and activities that trigger your cravings. ?Remember why you want to quit. ?Get plenty of sleep. ?Avoid coffee and other caffeinated drinks. These may worsen some of your symptoms. ?These symptoms may surprise you. But be assured that they are normal to have when quitting smoking. ?How to manage cravings ?Come up with a plan for how to deal with your cravings. The plan should include the following: ?A definition of the specific situation you want to deal with. ?An alternative action you will take. ?A clear idea for how this action   will help. ?The name of someone who might help you with this. ?Cravings usually last for 5-10 minutes. Consider taking the following actions to help you with your plan to deal with cravings: ?Keep your mouth busy. ?Chew sugar-free gum. ?Suck on hard candies or a straw. ?Brush your  teeth. ?Keep your hands and body busy. ?Change to a different activity right away. ?Squeeze or play with a ball. ?Do an activity or a hobby, such as making bead jewelry, practicing needlepoint, or working with wood. ?Mix up your normal routine. ?Take a short exercise break. Go for a quick walk or run up and down stairs. ?Focus on doing something kind or helpful for someone else. ?Call a friend or family member to talk during a craving. ?Join a support group. ?Contact a quitline. ?Where to find support ?To get help or find a support group: ?Call the National Cancer Institute's Smoking Quitline: 1-800-QUIT NOW (784-8669) ?Visit the website of the Substance Abuse and Mental Health Services Administration: www.samhsa.gov ?Text QUIT to SmokefreeTXT: 478848 ?Where to find more information ?Visit these websites to find more information on quitting smoking: ?National Cancer Institute: www.smokefree.gov ?American Lung Association: www.lung.org ?American Cancer Society: www.cancer.org ?Centers for Disease Control and Prevention: www.cdc.gov ?American Heart Association: www.heart.org ?Contact a health care provider if: ?You want to change your plan for quitting. ?The medicines you are taking are not helping. ?Your eating feels out of control or you cannot sleep. ?Get help right away if: ?You feel depressed or become very anxious. ?Summary ?Quitting smoking is a physical and mental challenge. You will face cravings, withdrawal symptoms, and temptation to smoke again. Preparation can help you as you go through these challenges. ?Try different techniques to manage stress, handle social situations, and prevent weight gain. ?You can deal with cravings by keeping your mouth busy (such as by chewing gum), keeping your hands and body busy, calling family or friends, or contacting a quitline for people who want to quit smoking. ?You can deal with withdrawal symptoms by avoiding places where people smoke, getting plenty of rest, and  avoiding drinks with caffeine. ?This information is not intended to replace advice given to you by your health care provider. Make sure you discuss any questions you have with your health care provider. ?Document Revised: 07/03/2021 Document Reviewed: 08/14/2019 ?Elsevier Patient Education ? 2022 Elsevier Inc. ? ?

## 2022-01-28 NOTE — Progress Notes (Signed)
? ?Subjective:  ? ? Patient ID: KOTY ANCTIL, male    DOB: 24-Aug-1966, 56 y.o.   MRN: [id number] ? ? ?Chief Complaint: Medical Management of Chronic Issues (Sinus and scratchy throat/Wants to restart trazadone/) ?  ? ?HPI: ? ?ASH MCELWAIN is a 56 y.o. who identifies as a male who was assigned male at birth.  ? ?Social history: ?Lives with: wife and son ?Work history: [employer] in [place] ? ? ?Comes in today for follow up of the following chronic medical issues: ? ?1. Primary hypertension ?No c/o chest pain, sob or headache.does not check blood pressure at home. ?BP Readings from Last 3 Encounters:  ?01/28/22 (!) 143/91  ?10/29/21 (!) 154/82  ?02/18/21 (!) 163/100  ? ? ? ? ?2. Depression, recurrent ([place]) ?Was on zoloft daily and is doing well. Has no medication side effects. He has actually stopped taking it since he took on a new job ? ?  01/28/2022  ? 10:33 AM 10/29/2021  ? 10:21 AM 05/20/2020  ? 11:11 AM  ?Depression screen PHQ 2/9  ?Decreased Interest 0 0 0  ?Down, Depressed, Hopeless 0 0 0  ?PHQ - 2 Score 0 0 0  ?Altered sleeping 2 2   ?Tired, decreased energy 0 0   ?Change in appetite 0 0   ?Feeling bad or failure about yourself  0 0   ?Trouble concentrating 0 0   ?Moving slowly or fidgety/restless 0 0   ?Suicidal thoughts 0 0   ?PHQ-9 Score 2 2   ?Difficult doing work/chores Not difficult at all Not difficult at all   ? ? ? ?3. Primary insomnia ?Is on trazadone and is doing well. Sleeps about 7-8 hours a night ? ?4. smoker ?Smokes over a pack a day. Has not had low dose CT scan. They have called him to schhedule. ? ?New complaints: ?Has had sinus pressure and congestion for over a week now. His voice has become raspy from the drainage. No cough or fever. ? ?No Known Allergies ?Outpatient Encounter Medications as of 01/28/2022  ?Medication Sig  ? lisinopril (ZESTRIL) 20 MG tablet Take 1 tablet (20 mg total) by mouth daily.  ? sertraline (ZOLOFT) 100 MG tablet Take 1 tablet (100 mg total) by  mouth daily. (Patient not taking: Reported on 01/21/2021)  ? traZODone (DESYREL) 50 MG tablet Take 0.5-1 tablets (25-50 mg total) by mouth at bedtime as needed for sleep. (Patient not taking: Reported on 01/21/2021)  ? ?No facility-administered encounter medications on file as of 01/28/2022.  ? ? ?Past Surgical History:  ?Procedure Laterality Date  ? ORCHIECTOMY N/A 11/08/1981  ? unilateral  ? tendon repair in hand  2022  ? ? ?Family History  ?Problem Relation Age of Onset  ? Diabetes Mother   ? Heart disease Father   ? ? ? ? ?Controlled substance contract: n/a ? ? ? ? ?Review of Systems  ?Constitutional:  Negative for diaphoresis.  ?Eyes:  Negative for pain.  ?Respiratory:  Negative for shortness of breath.   ?Cardiovascular:  Negative for chest pain, palpitations and leg swelling.  ?Gastrointestinal:  Negative for abdominal pain.  ?Endocrine: Negative for polydipsia.  ?Skin:  Negative for rash.  ?Neurological:  Negative for dizziness, weakness and headaches.  ?Hematological:  Does not bruise/bleed easily.  ?All other systems reviewed and are negative. ? ?   ?Objective:  ? Physical Exam ?Vitals and nursing note reviewed.  ?Constitutional:   ?   Appearance: Normal appearance. He is well-developed.  ?HENT:  ?  Head: Normocephalic.  ?   Nose: Nose normal.  ?   Mouth/Throat:  ?   Mouth: Mucous membranes are moist.  ?   Pharynx: Oropharynx is clear.  ?Eyes:  ?   Pupils: Pupils are equal, round, and reactive to light.  ?Neck:  ?   Thyroid: No thyroid mass or thyromegaly.  ?   Vascular: No carotid bruit or JVD.  ?   Trachea: Phonation normal.  ?Cardiovascular:  ?   Rate and Rhythm: Normal rate and regular rhythm.  ?Pulmonary:  ?   Effort: Pulmonary effort is normal. No respiratory distress.  ?   Breath sounds: Normal breath sounds.  ?Abdominal:  ?   General: Bowel sounds are normal.  ?   Palpations: Abdomen is soft.  ?   Tenderness: There is no abdominal tenderness.  ?Musculoskeletal:     ?   General: Normal range of  motion.  ?   Cervical back: Normal range of motion and neck supple.  ?Lymphadenopathy:  ?   Cervical: No cervical adenopathy.  ?Skin: ?   General: Skin is warm and dry.  ?Neurological:  ?   Mental Status: He is alert and oriented to person, place, and time.  ?Psychiatric:     ?   Behavior: Behavior normal.     ?   Thought Content: Thought content normal.     ?   Judgment: Judgment normal.  ? ? ?BP 132/88   Pulse 68   Temp 98.2 ?F (36.8 ?C) (Temporal)   Resp 20   Ht '5\' 6"'  (1.676 m)   Wt 172 lb (78 kg)   SpO2 98%   BMI 27.76 kg/m?  ? ? ? ? ? ?   ?Assessment & Plan:  ?SHINE MIKES comes in today with chief complaint of Medical Management of Chronic Issues (Sinus and scratchy throat/Wants to restart trazadone/) ? ? ?Diagnosis and orders addressed: ? ?1. Primary hypertension ?Low sodium diet ?- lisinopril (ZESTRIL) 20 MG tablet; Take 1 tablet (20 mg total) by mouth daily.  Dispense: 90 tablet; Refill: 1 ?- CBC with Differential/Platelet ?- CMP14+EGFR ?- Lipid panel ? ?2. Depression, recurrent ([place]) ?Stress management ? ?3. Primary insomnia ?Bedtime routine ?- traZODone (DESYREL) 50 MG tablet; Take 0.5-1 tablets (25-50 mg total) by mouth at bedtime as needed for sleep.  Dispense: 90 tablet; Refill: 1 ? ?4. Acute recurrent maxillary sinusitis ?1. Take meds as prescribed ?2. Use a cool mist humidifier especially during the winter months and when heat has been humid. ?3. Use saline nose sprays frequently ?4. Saline irrigations of the nose can be very helpful if done frequently. ? * 4X daily for 1 week* ? * Use of a nettie pot can be helpful with this. Follow directions with this* ?5. Drink plenty of fluids ?6. Keep thermostat turn down low ?7.For any cough or congestion- mucinex ?8. For fever or aces or pains- take tylenol or ibuprofen appropriate for age and weight. ? * for fevers greater than 101 orally you may alternate ibuprofen and tylenol every  3 hours. ?  ? ?- amoxicillin-clavulanate (AUGMENTIN) 875-125  MG tablet; Take 1 tablet by mouth 2 (two) times daily.  Dispense: 14 tablet; Refill: 0 ? ?5. Smoker ?Smoking cessation encouraged ?Patient will call and schedule low dose CT scan that has already been ordered ? ? ?Labs pending ?Health Maintenance reviewed ?Diet and exercise encouraged ? ?Follow up plan: ?6 months ? ? ?Mary-Margaret Hassell Done, FNP ? ? ?

## 2022-04-29 IMAGING — DX DG CHEST 2V
2 series · 2 of 2 positions shown · non-contrast
Comparison: 10/07/2015

CLINICAL DATA: Smoker

EXAM:
CHEST - 2 VIEW

[chest pa]
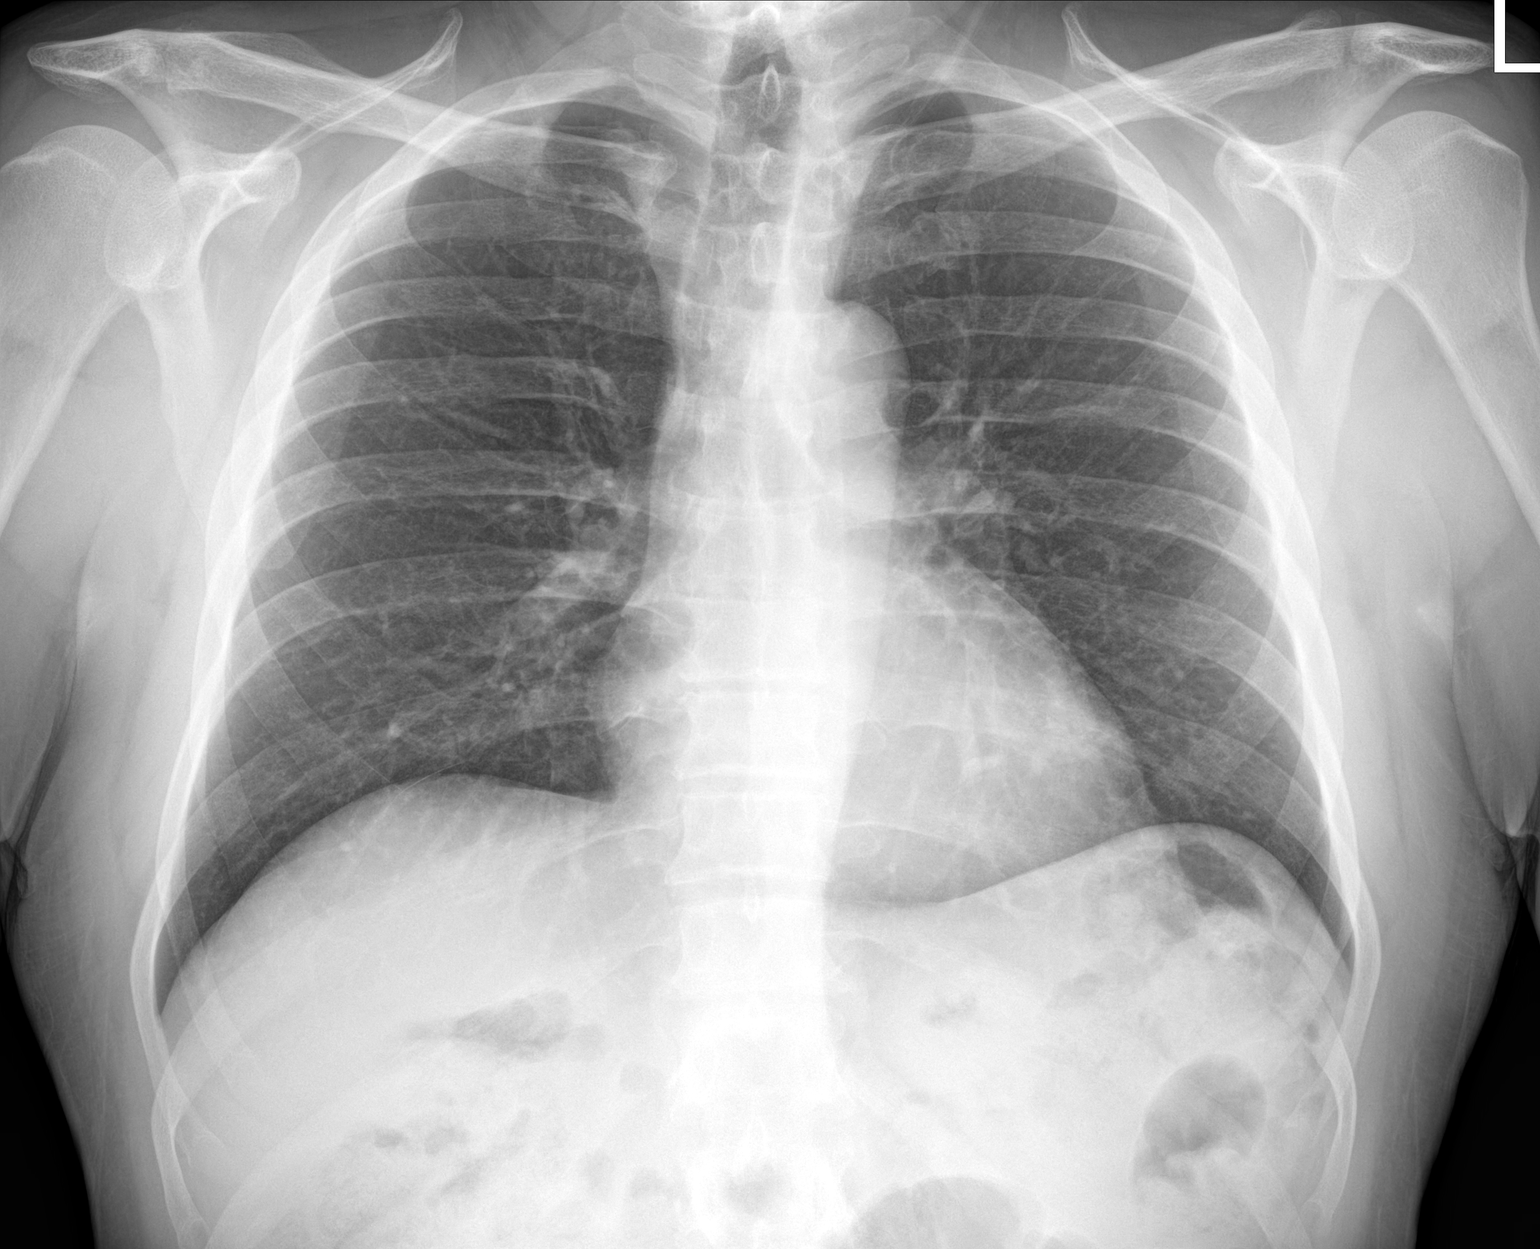

[chest lat]
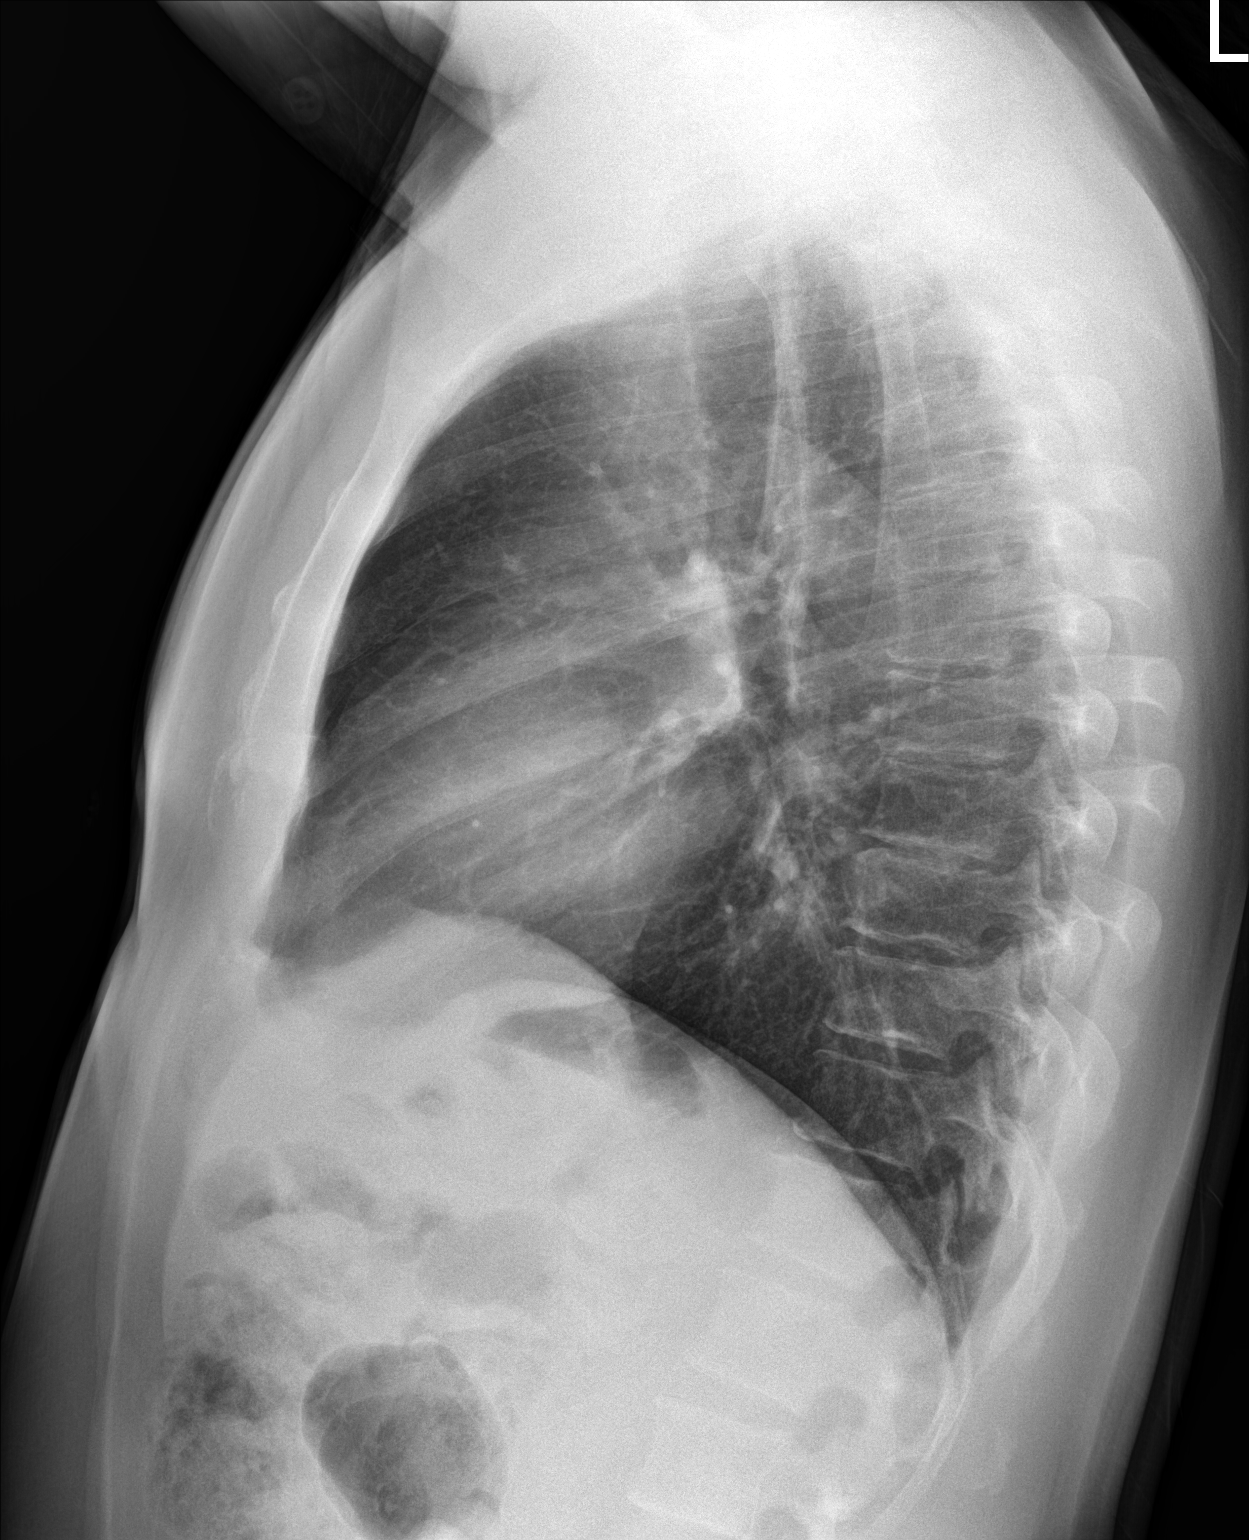

[2 of 2 positions shown; findings below may reference images not displayed]

FINDINGS: The heart size and mediastinal contours are within normal limits.
Both lungs are clear. The visualized skeletal structures are
unremarkable.
IMPRESSION: Negative

## 2022-08-03 ENCOUNTER — Ambulatory Visit: Payer: BC Managed Care – PPO | Admitting: Nurse Practitioner

## 2022-08-03 ENCOUNTER — Encounter: Payer: Self-pay | Admitting: Nurse Practitioner

## 2022-08-03 VITALS — BP 140/81 | HR 74 | Temp 98.0°F | Ht 66.0 in | Wt 177.5 lb

## 2022-08-03 DIAGNOSIS — Z125 Encounter for screening for malignant neoplasm of prostate: Secondary | ICD-10-CM

## 2022-08-03 DIAGNOSIS — F5101 Primary insomnia: Secondary | ICD-10-CM | POA: Diagnosis not present

## 2022-08-03 DIAGNOSIS — I10 Essential (primary) hypertension: Secondary | ICD-10-CM

## 2022-08-03 DIAGNOSIS — F339 Major depressive disorder, recurrent, unspecified: Secondary | ICD-10-CM | POA: Diagnosis not present

## 2022-08-03 DIAGNOSIS — Z23 Encounter for immunization: Secondary | ICD-10-CM

## 2022-08-03 DIAGNOSIS — M6740 Ganglion, unspecified site: Secondary | ICD-10-CM

## 2022-08-03 MED ORDER — TRAZODONE HCL 50 MG PO TABS: 25.0000 mg | ORAL_TABLET | Freq: Every evening | ORAL | 1 refills | Status: DC | PRN

## 2022-08-03 MED ORDER — LISINOPRIL 20 MG PO TABS: 20.0000 mg | ORAL_TABLET | Freq: Every day | ORAL | 1 refills | Status: DC

## 2022-08-03 NOTE — Patient Instructions (Signed)
Influenza Virus Vaccine injection What is this medication? INFLUENZA VIRUS VACCINE (in floo EN zuh VAHY ruhs vak SEEN) helps to reduce the risk of getting influenza also known as the flu. The vaccine only helps protect you against some strains of the flu. This medicine may be used for other purposes; ask your health care provider or pharmacist if you have questions. COMMON BRAND NAME(S): Afluria, Afluria Quadrivalent, Agriflu, Alfuria, FLUAD, FLUAD Quadrivalent, Fluarix, Fluarix Quadrivalent, Flublok, Flublok Quadrivalent, FLUCELVAX, FLUCELVAX Quadrivalent, Flulaval, Flulaval Quadrivalent, Fluvirin, Fluzone, Fluzone High-Dose, Fluzone Intradermal, Fluzone Quadrivalent What should I tell my care team before I take this medication? They need to know if you have any of these conditions: bleeding disorder like hemophilia fever or infection Guillain-Barre syndrome or other neurological problems immune system problems infection with the human immunodeficiency virus (HIV) or AIDS low blood platelet counts multiple sclerosis an unusual or allergic reaction to influenza virus vaccine, latex, other medicines, foods, dyes, or preservatives. Different brands of vaccines contain different allergens. Some may contain latex or eggs. Talk to your doctor about your allergies to make sure that you get the right vaccine. pregnant or trying to get pregnant breast-feeding How should I use this medication? This vaccine is for injection into a muscle or under the skin. It is given by a health care professional. A copy of Vaccine Information Statements will be given before each vaccination. Read this sheet carefully each time. The sheet may change frequently. Talk to your healthcare provider to see which vaccines are right for you. Some vaccines should not be used in all age groups. Overdosage: If you think you have taken too much of this medicine contact a poison control center or emergency room at once. NOTE: This  medicine is only for you. Do not share this medicine with others. What if I miss a dose? This does not apply. What may interact with this medication? chemotherapy or radiation therapy medicines that lower your immune system like etanercept, anakinra, infliximab, and adalimumab medicines that treat or prevent blood clots like warfarin phenytoin steroid medicines like prednisone or cortisone theophylline vaccines This list may not describe all possible interactions. Give your health care provider a list of all the medicines, herbs, non-prescription drugs, or dietary supplements you use. Also tell them if you smoke, drink alcohol, or use illegal drugs. Some items may interact with your medicine. What should I watch for while using this medication? Report any side effects that do not go away within 3 days to your doctor or health care professional. Call your health care provider if any unusual symptoms occur within 6 weeks of receiving this vaccine. You may still catch the flu, but the illness is not usually as bad. You cannot get the flu from the vaccine. The vaccine will not protect against colds or other illnesses that may cause fever. The vaccine is needed every year. What side effects may I notice from receiving this medication? Side effects that you should report to your doctor or health care professional as soon as possible: allergic reactions like skin rash, itching or hives, swelling of the face, lips, or tongue Side effects that usually do not require medical attention (report to your doctor or health care professional if they continue or are bothersome): fever headache muscle aches and pains pain, tenderness, redness, or swelling at the injection site tiredness This list may not describe all possible side effects. Call your doctor for medical advice about side effects. You may report side effects to FDA at 1-800-FDA-1088.   Where should I keep my medication? The vaccine will be given  by a health care professional in a clinic, pharmacy, doctor's office, or other health care setting. You will not be given vaccine doses to store at home. NOTE: This sheet is a summary. It may not cover all possible information. If you have questions about this medicine, talk to your doctor, pharmacist, or health care provider.  2023 Elsevier/Gold Standard (2021-05-29 00:00:00)  

## 2022-08-03 NOTE — Progress Notes (Signed)
Subjective:    Patient ID: Kenneth Greene, male    DOB: September 10, 1966, 56 y.o.   MRN: [id number]   Chief Complaint: medical management of chronic issues     HPI:  Kenneth Greene is a 56 y.o. who identifies as a male who was assigned male at birth.   Social history: Lives with: wife and kids Work history: [employer]   Comes in today for follow up of the following chronic medical issues:  1. Primary hypertension No c/o chest pain, sob or headache. Doe snot check blood pressure at home. BP Readings from Last 3 Encounters:  01/28/22 132/88  10/29/21 (!) 154/82  02/18/21 (!) 163/100     2. Depression, recurrent ([place]) Is on no prescription meds nad is doing well.    08/03/2022    9:10 AM 01/28/2022   10:33 AM 10/29/2021   10:21 AM  Depression screen PHQ 2/9  Decreased Interest 0 0 0  Down, Depressed, Hopeless 0 0 0  PHQ - 2 Score 0 0 0  Altered sleeping 0 2 2  Tired, decreased energy 0 0 0  Change in appetite 0 0 0  Feeling bad or failure about yourself  0 0 0  Trouble concentrating 0 0 0  Moving slowly or fidgety/restless 0 0 0  Suicidal thoughts 0 0 0  PHQ-9 Score 0 2 2  Difficult doing work/chores Not difficult at all Not difficult at all Not difficult at all     3. Primary insomnia Takes trazadone  to sleep at night helps some Sleeps about 4hours at night. He doe snot want to try anything else right now.   New complaints: None today  No Known Allergies Outpatient Encounter Medications as of 08/03/2022  Medication Sig   amoxicillin-clavulanate (AUGMENTIN) 875-125 MG tablet Take 1 tablet by mouth 2 (two) times daily.   lisinopril (ZESTRIL) 20 MG tablet Take 1 tablet (20 mg total) by mouth daily.   traZODone (DESYREL) 50 MG tablet Take 0.5-1 tablets (25-50 mg total) by mouth at bedtime as needed for sleep.   No facility-administered encounter medications on file as of 08/03/2022.    Past Surgical History:  Procedure Laterality Date   ORCHIECTOMY N/A  11/08/1981   unilateral   tendon repair in hand  2022    Family History  Problem Relation Age of Onset   Diabetes Mother    Heart disease Father       Controlled substance contract: n/a     Review of Systems  Constitutional:  Negative for diaphoresis.  Eyes:  Negative for pain.  Respiratory:  Negative for shortness of breath.   Cardiovascular:  Negative for chest pain, palpitations and leg swelling.  Gastrointestinal:  Negative for abdominal pain.  Endocrine: Negative for polydipsia.  Skin:  Negative for rash.  Neurological:  Negative for dizziness, weakness and headaches.  Hematological:  Does not bruise/bleed easily.  All other systems reviewed and are negative.      Objective:   Physical Exam Vitals and nursing note reviewed.  Constitutional:      Appearance: Normal appearance. He is well-developed.  HENT:     Head: Normocephalic.     Nose: Nose normal.     Mouth/Throat:     Mouth: Mucous membranes are moist.     Pharynx: Oropharynx is clear.  Eyes:     Pupils: Pupils are equal, round, and reactive to light.  Neck:     Thyroid: No thyroid mass or thyromegaly.  Vascular: No carotid bruit or JVD.     Trachea: Phonation normal.  Cardiovascular:     Rate and Rhythm: Normal rate and regular rhythm.  Pulmonary:     Effort: Pulmonary effort is normal. No respiratory distress.     Breath sounds: Normal breath sounds.  Abdominal:     General: Bowel sounds are normal.     Palpations: Abdomen is soft.     Tenderness: There is no abdominal tenderness.  Musculoskeletal:        General: Normal range of motion.     Cervical back: Normal range of motion and neck supple.  Lymphadenopathy:     Cervical: No cervical adenopathy.  Skin:    General: Skin is warm and dry.     Comments: moveable nontender cyst on dorsal surface of left forearm  Neurological:     Mental Status: He is alert and oriented to person, place, and time.  Psychiatric:        Behavior:  Behavior normal.        Thought Content: Thought content normal.        Judgment: Judgment normal.     BP (!) 140/81   Pulse 74   Temp 98 F (36.7 C) (Temporal)   Ht '5\' 6"'  (1.676 m)   Wt 177 lb 8 oz (80.5 kg)   SpO2 97%   BMI 28.65 kg/m        Assessment & Plan:  Kenneth Greene comes in today with chief complaint of Medical Management of Chronic Issues and Hypertension   Diagnosis and orders addressed:  1. Primary hypertension Low sodium diet - CBC with Differential/Platelet - CMP14+EGFR - Lipid panel - lisinopril (ZESTRIL) 20 MG tablet; Take 1 tablet (20 mg total) by mouth daily.  Dispense: 90 tablet; Refill: 1  2. Depression, recurrent ([place]) stresanagement  3. Primary insomnia Bedtime routine - traZODone (DESYREL) 50 MG tablet; Take 0.5-1 tablets (25-50 mg total) by mouth at bedtime as needed for sleep.  Dispense: 90 tablet; Refill: 1  4. Prostate cancer screening Labs pending - PSA, total and free  5. Ganglion cyst They will call with appointment - Ambulatory referral to Orthopedic Surgery   Labs pending Health Maintenance reviewed Diet and exercise encouraged  Follow up plan: 6 months   Mary-Margaret Hassell Done, FNP

## 2022-08-04 LAB — CBC WITH DIFFERENTIAL/PLATELET
Basophils Absolute: 0.1 10*3/uL (ref 0.0–0.2)
Basos: 1 %
EOS (ABSOLUTE): 0.2 10*3/uL (ref 0.0–0.4)
Eos: 2 %
Hematocrit: 44.2 % (ref 37.5–51.0)
Hemoglobin: 14.8 g/dL (ref 13.0–17.7)
Immature Grans (Abs): 0 10*3/uL (ref 0.0–0.1)
Immature Granulocytes: 0 %
Lymphocytes Absolute: 3.3 10*3/uL — ABNORMAL HIGH (ref 0.7–3.1)
Lymphs: 36 %
MCH: 30.4 pg (ref 26.6–33.0)
MCHC: 33.5 g/dL (ref 31.5–35.7)
MCV: 91 fL (ref 79–97)
Monocytes Absolute: 0.8 10*3/uL (ref 0.1–0.9)
Monocytes: 9 %
Neutrophils Absolute: 4.9 10*3/uL (ref 1.4–7.0)
Neutrophils: 52 %
Platelets: 277 10*3/uL (ref 150–450)
RBC: 4.87 x10E6/uL (ref 4.14–5.80)
RDW: 13.2 % (ref 11.6–15.4)
WBC: 9.3 10*3/uL (ref 3.4–10.8)

## 2022-08-04 LAB — CMP14+EGFR
ALT: 16 IU/L (ref 0–44)
AST: 17 IU/L (ref 0–40)
Albumin/Globulin Ratio: 1.9 (ref 1.2–2.2)
Albumin: 4.3 g/dL (ref 3.8–4.9)
Alkaline Phosphatase: 102 IU/L (ref 44–121)
BUN/Creatinine Ratio: 21 — ABNORMAL HIGH (ref 9–20)
BUN: 18 mg/dL (ref 6–24)
Bilirubin Total: <0.2 mg/dL (ref 0.0–1.2)
CO2: 20 mmol/L (ref 20–29)
Calcium: 9.6 mg/dL (ref 8.7–10.2)
Chloride: 102 mmol/L (ref 96–106)
Creatinine, Ser: 0.87 mg/dL (ref 0.76–1.27)
Globulin, Total: 2.3 g/dL (ref 1.5–4.5)
Glucose: 91 mg/dL (ref 70–99)
Potassium: 4.4 mmol/L (ref 3.5–5.2)
Sodium: 138 mmol/L (ref 134–144)
Total Protein: 6.6 g/dL (ref 6.0–8.5)
eGFR: 101 mL/min/{1.73_m2} (ref >59)

## 2022-08-04 LAB — LIPID PANEL
Chol/HDL Ratio: 4.3 ratio (ref 0.0–5.0)
Cholesterol, Total: 188 mg/dL (ref 100–199)
HDL: 44 mg/dL (ref >39)
LDL Chol Calc (NIH): 132 mg/dL — ABNORMAL HIGH (ref 0–99)
Triglycerides: 65 mg/dL (ref 0–149)
VLDL Cholesterol Cal: 12 mg/dL (ref 5–40)

## 2022-08-04 LAB — PSA, TOTAL AND FREE
PSA, Free Pct: 27.9 %
PSA, Free: 0.39 ng/mL
Prostate Specific Ag, Serum: 1.4 ng/mL (ref 0.0–4.0)

## 2022-08-27 ENCOUNTER — Ambulatory Visit: Payer: BC Managed Care – PPO | Admitting: Orthopaedic Surgery

## 2022-08-27 ENCOUNTER — Ambulatory Visit (INDEPENDENT_AMBULATORY_CARE_PROVIDER_SITE_OTHER): Payer: BC Managed Care – PPO

## 2022-08-27 DIAGNOSIS — M79641 Pain in right hand: Secondary | ICD-10-CM | POA: Diagnosis not present

## 2022-08-27 DIAGNOSIS — R2232 Localized swelling, mass and lump, left upper limb: Secondary | ICD-10-CM

## 2022-08-27 NOTE — Progress Notes (Signed)
Office Visit Note   Patient: Kenneth Greene           Date of Birth: 1966-08-14           MRN: [id number] Visit Date: 08/27/2022              Requested by: Chevis Pretty, [address],  [place] [address] PCP: Chevis Pretty, FNP   Assessment & Plan: Visit Diagnoses:  1. Pain in right hand   2. Forearm mass, left     Plan: In regards to the forearm mass treatment options were explained and he has elected to undergo excisional biopsy in the near future.  For the right thumb impression is sprain.  Continue symptomatic treatment.  Jackelyn Poling will meet with the patient today to schedule surgery for the forearm mass.  Follow-Up Instructions: No follow-ups on file.   Orders:  Orders Placed This Encounter  Procedures   XR Hand Complete Right   No orders of the defined types were placed in this encounter.     Procedures: No procedures performed   Clinical Data: No additional findings.   Subjective: Chief Complaint  Patient presents with   Left Wrist - Ganglion Cyst    HPI Mr. Kenneth Greene is a very pleasant gentleman who is here for evaluation of 2 separate problems.  The first problem is mass to the left dorsal forearm that he has had for couple years.  Denies any pain or injuries.  Has not noticed any significant change in size but it does bother him and would like to have this excised.  Occasionally feels some numbness radiating down into his fingers.  The other problem is right thumb pain after a fall about 2 weeks ago where he jammed it axially.  Review of Systems  Constitutional: Negative.   All other systems reviewed and are negative.    Objective: Vital Signs: There were no vitals taken for this visit.  Physical Exam Vitals and nursing note reviewed.  Constitutional:      Appearance: He is well-developed.  HENT:     Head: Normocephalic and atraumatic.  Eyes:     Pupils: Pupils are equal, round, and reactive to light.   Pulmonary:     Effort: Pulmonary effort is normal.  Abdominal:     Palpations: Abdomen is soft.  Musculoskeletal:        General: Normal range of motion.     Cervical back: Neck supple.  Skin:    General: Skin is warm.  Neurological:     Mental Status: He is alert and oriented to person, place, and time.  Psychiatric:        Behavior: Behavior normal.        Thought Content: Thought content normal.        Judgment: Judgment normal.     Ortho Exam Examination of the left forearm shows a firm well-circumscribed circular semimobile mass to the dorsal aspect of the midforearm.  This does not transilluminate light.  No overlying skin changes.  Normal motor or sensory function of the hand.  Examination of the right hand shows intact collateral ligaments of the MP joint.  There is no pain with thumb CMC grind test.  He has good strength and function of the thenar muscles. Specialty Comments:  No specialty comments available.  Imaging: XR Hand Complete Right  Result Date: 08/27/2022 AP oblique and lateral x-rays of the right hand showed no acute abnormalities to the MP joint or thumb CMC  joint.    PMFS History: Patient Active Problem List   Diagnosis Date Noted   Pain in right hand 08/27/2022   Forearm mass, left 08/27/2022   Hypertension 01/28/2022   Depression, recurrent ([place]) 01/28/2022   Insomnia 01/28/2022   Past Medical History:  Diagnosis Date   Cancer ([place])    TESTICULAR    Family History  Problem Relation Age of Onset   Diabetes Mother    Heart disease Father     Past Surgical History:  Procedure Laterality Date   ORCHIECTOMY N/A 11/08/1981   unilateral   tendon repair in hand  2022   Social History   Occupational History   Not on file  Tobacco Use   Smoking status: Every Day    Types: Cigarettes   Smokeless tobacco: Never  Vaping Use   Vaping Use: Some days  Substance and Sexual Activity   Alcohol use: No    Alcohol/week: 0.0 standard drinks of  alcohol   Drug use: No   Sexual activity: Not on file

## 2022-09-02 ENCOUNTER — Telehealth: Payer: Self-pay | Admitting: Orthopaedic Surgery

## 2022-09-02 NOTE — Telephone Encounter (Signed)
Patient cancelled excision left forearm mass with Dr. Erlinda Hong scheduled at [facility] on 09-16-22.  He said it was not a good time to be having surgery and would call back if he changed his mind.

## 2022-09-02 NOTE — Telephone Encounter (Signed)
thanks

## 2022-09-10 ENCOUNTER — Encounter: Payer: BC Managed Care – PPO | Admitting: Orthopaedic Surgery

## 2022-09-23 ENCOUNTER — Encounter: Payer: BC Managed Care – PPO | Admitting: Orthopaedic Surgery

## 2023-02-01 ENCOUNTER — Ambulatory Visit: Payer: BC Managed Care – PPO | Admitting: Nurse Practitioner

## 2023-02-01 VITALS — BP 144/82 | HR 64 | Temp 97.2°F | Resp 20 | Ht 66.0 in | Wt 188.0 lb

## 2023-02-01 DIAGNOSIS — Z0001 Encounter for general adult medical examination with abnormal findings: Secondary | ICD-10-CM | POA: Diagnosis not present

## 2023-02-01 DIAGNOSIS — F5101 Primary insomnia: Secondary | ICD-10-CM | POA: Diagnosis not present

## 2023-02-01 DIAGNOSIS — Z0000 Encounter for general adult medical examination without abnormal findings: Secondary | ICD-10-CM

## 2023-02-01 DIAGNOSIS — F339 Major depressive disorder, recurrent, unspecified: Secondary | ICD-10-CM | POA: Diagnosis not present

## 2023-02-01 DIAGNOSIS — I10 Essential (primary) hypertension: Secondary | ICD-10-CM

## 2023-02-01 DIAGNOSIS — Z6828 Body mass index (BMI) 28.0-28.9, adult: Secondary | ICD-10-CM | POA: Insufficient documentation

## 2023-02-01 LAB — LIPID PANEL

## 2023-02-01 MED ORDER — MIRTAZAPINE 30 MG PO TBDP: 30.0000 mg | ORAL_TABLET | Freq: Every day | ORAL | 5 refills | Status: DC

## 2023-02-01 MED ORDER — LISINOPRIL 20 MG PO TABS: 20.0000 mg | ORAL_TABLET | Freq: Every day | ORAL | 1 refills | Status: DC

## 2023-02-01 NOTE — Progress Notes (Signed)
Subjective:    Patient ID: Kenneth Greene, male    DOB: [birth year], 57 y.o.   MRN: [id number]   Chief Complaint: annual physical   HPI:  Kenneth Greene is a 57 y.o. who identifies as a male who was assigned male at birth.   Social history: Lives with: wife and kids Work history: [employer]   Comes in today for follow up of the following chronic medical issues:  1. Primary hypertension No c/o chest pain SOB or headache. Does not check blood pressure at home. BP Readings from Last 3 Encounters:  08/03/22 (!) 140/81  01/28/22 132/88  10/29/21 (!) 154/82     2. Depression, recurrent ([place]) Is currently on no antidepressants    02/01/2023    8:31 AM 08/03/2022    9:10 AM 01/28/2022   10:33 AM  Depression screen PHQ 2/9  Decreased Interest 0 0 0  Down, Depressed, Hopeless 0 0 0  PHQ - 2 Score 0 0 0  Altered sleeping 3 0 2  Tired, decreased energy 3 0 0  Change in appetite 0 0 0  Feeling bad or failure about yourself  0 0 0  Trouble concentrating 0 0 0  Moving slowly or fidgety/restless 0 0 0  Suicidal thoughts 0 0 0  PHQ-9 Score 6 0 2  Difficult doing work/chores Not difficult at all Not difficult at all Not difficult at all     3. Primary insomnia Is on trazadone to sleep he says it does not really help. Sleeps 3-5 hours at a time.   New complaints: None today  No Known Allergies Outpatient Encounter Medications as of 02/01/2023  Medication Sig   lisinopril (ZESTRIL) 20 MG tablet Take 1 tablet (20 mg total) by mouth daily.   traZODone (DESYREL) 50 MG tablet Take 0.5-1 tablets (25-50 mg total) by mouth at bedtime as needed for sleep.   No facility-administered encounter medications on file as of 02/01/2023.    Past Surgical History:  Procedure Laterality Date   ORCHIECTOMY N/A 11/08/1981   unilateral   tendon repair in hand  2022    Family History  Problem Relation Age of Onset   Diabetes Mother    Heart disease Father       Controlled  substance contract: n/a     Review of Systems  Constitutional:  Negative for diaphoresis.  Eyes:  Negative for pain.  Respiratory:  Negative for shortness of breath.   Cardiovascular:  Negative for chest pain, palpitations and leg swelling.  Gastrointestinal:  Negative for abdominal pain.  Endocrine: Negative for polydipsia.  Skin:  Negative for rash.  Neurological:  Negative for dizziness, weakness and headaches.  Hematological:  Does not bruise/bleed easily.  All other systems reviewed and are negative.      Objective:   Physical Exam Vitals and nursing note reviewed.  Constitutional:      Appearance: Normal appearance. He is well-developed.  HENT:     Head: Normocephalic.     Nose: Nose normal.     Mouth/Throat:     Mouth: Mucous membranes are moist.     Pharynx: Oropharynx is clear.  Eyes:     Pupils: Pupils are equal, round, and reactive to light.  Neck:     Thyroid: No thyroid mass or thyromegaly.     Vascular: No carotid bruit or JVD.     Trachea: Phonation normal.  Cardiovascular:     Rate and Rhythm: Normal rate and regular rhythm.  Pulmonary:  Effort: Pulmonary effort is normal. No respiratory distress.     Breath sounds: Normal breath sounds.  Abdominal:     General: Bowel sounds are normal.     Palpations: Abdomen is soft.     Tenderness: There is no abdominal tenderness.  Musculoskeletal:        General: Normal range of motion.     Cervical back: Normal range of motion and neck supple.  Lymphadenopathy:     Cervical: No cervical adenopathy.  Skin:    General: Skin is warm and dry.  Neurological:     General: No focal deficit present.     Mental Status: He is alert and oriented to person, place, and time.     Cranial Nerves: No cranial nerve deficit.     Sensory: No sensory deficit.  Psychiatric:        Behavior: Behavior normal.        Thought Content: Thought content normal.        Judgment: Judgment normal.       BP (!) 144/82    Pulse 64   Temp (!) 97.2 F (36.2 C) (Temporal)   Resp 20   Ht 5\' 6"  (1.676 m)   Wt 188 lb (85.3 kg)   SpO2 97%   BMI 30.34 kg/m      Assessment & Plan:   Kenneth Greene comes in today with chief complaint of Medical Management of Chronic Issues   Diagnosis and orders addressed:  1. Annual physical exam   2. Primary hypertension Low sodium diet - lisinopril (ZESTRIL) 20 MG tablet; Take 1 tablet (20 mg total) by mouth daily.  Dispense: 90 tablet; Refill: 1 - CBC with Differential/Platelet - CMP14+EGFR - Lipid panel  3. Depression, recurrent ([place]) Stress management  4. Primary insomnia Stop trazadone Added remeron  - mirtazapine (REMERON SOL-TAB) 30 MG disintegrating tablet; Take 1 tablet (30 mg total) by mouth at bedtime.  Dispense: 30 tablet; Refill: 5  5. BMI 28.0-28.9,adult Discussed diet and exercise for person with BMI >25 Will recheck weight in 3-6 months    Labs pending Health Maintenance reviewed Diet and exercise encouraged  Follow up plan: 6 months   Mary-Margaret Hassell Done, FNP

## 2023-02-02 ENCOUNTER — Other Ambulatory Visit: Payer: Self-pay | Admitting: Nurse Practitioner

## 2023-02-02 DIAGNOSIS — F5101 Primary insomnia: Secondary | ICD-10-CM

## 2023-02-02 LAB — CMP14+EGFR
ALT: 18 IU/L (ref 0–44)
AST: 18 IU/L (ref 0–40)
Albumin/Globulin Ratio: 1.8 (ref 1.2–2.2)
Albumin: 4.3 g/dL (ref 3.8–4.9)
Alkaline Phosphatase: 96 IU/L (ref 44–121)
BUN/Creatinine Ratio: 20 (ref 9–20)
BUN: 19 mg/dL (ref 6–24)
Bilirubin Total: 0.2 mg/dL (ref 0.0–1.2)
CO2: 22 mmol/L (ref 20–29)
Calcium: 9.9 mg/dL (ref 8.7–10.2)
Chloride: 103 mmol/L (ref 96–106)
Creatinine, Ser: 0.95 mg/dL (ref 0.76–1.27)
Globulin, Total: 2.4 g/dL (ref 1.5–4.5)
Glucose: 101 mg/dL — ABNORMAL HIGH (ref 70–99)
Potassium: 4.3 mmol/L (ref 3.5–5.2)
Sodium: 139 mmol/L (ref 134–144)
Total Protein: 6.7 g/dL (ref 6.0–8.5)
eGFR: 94 mL/min/{1.73_m2} (ref >59)

## 2023-02-02 LAB — CBC WITH DIFFERENTIAL/PLATELET
Basophils Absolute: 0.2 10*3/uL (ref 0.0–0.2)
Basos: 1 %
EOS (ABSOLUTE): 0.2 10*3/uL (ref 0.0–0.4)
Eos: 2 %
Hematocrit: 45.6 % (ref 37.5–51.0)
Hemoglobin: 15.4 g/dL (ref 13.0–17.7)
Immature Grans (Abs): 0 10*3/uL (ref 0.0–0.1)
Immature Granulocytes: 0 %
Lymphocytes Absolute: 4.3 10*3/uL — ABNORMAL HIGH (ref 0.7–3.1)
Lymphs: 34 %
MCH: 30.6 pg (ref 26.6–33.0)
MCHC: 33.8 g/dL (ref 31.5–35.7)
MCV: 91 fL (ref 79–97)
Monocytes Absolute: 1.2 10*3/uL — ABNORMAL HIGH (ref 0.1–0.9)
Monocytes: 10 %
Neutrophils Absolute: 6.6 10*3/uL (ref 1.4–7.0)
Neutrophils: 53 %
Platelets: 269 10*3/uL (ref 150–450)
RBC: 5.03 x10E6/uL (ref 4.14–5.80)
RDW: 13.3 % (ref 11.6–15.4)
WBC: 12.4 10*3/uL — ABNORMAL HIGH (ref 3.4–10.8)

## 2023-02-02 LAB — LIPID PANEL
Chol/HDL Ratio: 4.7 ratio (ref 0.0–5.0)
Cholesterol, Total: 183 mg/dL (ref 100–199)
HDL: 39 mg/dL — ABNORMAL LOW (ref >39)
LDL Chol Calc (NIH): 119 mg/dL — ABNORMAL HIGH (ref 0–99)
Triglycerides: 140 mg/dL (ref 0–149)
VLDL Cholesterol Cal: 25 mg/dL (ref 5–40)

## 2023-02-02 NOTE — Telephone Encounter (Signed)
Name from pharmacy: MIRTAZAPINE ODT 30MG  TAB        Will file in chart as: mirtazapine (REMERON SOL-TAB) 30 MG disintegrating tablet    Pharmacy comment: NDC not covered by insurance.  regular tablets covered. please send new rx.

## 2023-08-05 ENCOUNTER — Encounter: Payer: Self-pay | Admitting: Nurse Practitioner

## 2023-08-05 ENCOUNTER — Ambulatory Visit: Payer: BC Managed Care – PPO | Admitting: Nurse Practitioner

## 2023-08-05 VITALS — BP 140/82 | HR 80 | Temp 97.9°F | Resp 20 | Ht 66.0 in | Wt 182.0 lb

## 2023-08-05 DIAGNOSIS — F339 Major depressive disorder, recurrent, unspecified: Secondary | ICD-10-CM

## 2023-08-05 DIAGNOSIS — F5101 Primary insomnia: Secondary | ICD-10-CM | POA: Diagnosis not present

## 2023-08-05 DIAGNOSIS — Z23 Encounter for immunization: Secondary | ICD-10-CM

## 2023-08-05 DIAGNOSIS — Z125 Encounter for screening for malignant neoplasm of prostate: Secondary | ICD-10-CM

## 2023-08-05 DIAGNOSIS — I10 Essential (primary) hypertension: Secondary | ICD-10-CM

## 2023-08-05 LAB — LIPID PANEL

## 2023-08-05 MED ORDER — LISINOPRIL 20 MG PO TABS: 20.0000 mg | ORAL_TABLET | Freq: Every day | ORAL | 1 refills | Status: DC

## 2023-08-05 MED ORDER — MIRTAZAPINE 30 MG PO TABS
30.0000 mg | ORAL_TABLET | Freq: Every day | ORAL | 1 refills | Status: DC
Start: 2023-08-05 — End: 2024-01-30

## 2023-08-05 MED ORDER — SERTRALINE HCL 100 MG PO TABS
100.0000 mg | ORAL_TABLET | Freq: Every day | ORAL | 1 refills | Status: DC
Start: 2023-08-05 — End: 2024-01-30

## 2023-08-05 NOTE — Progress Notes (Signed)
Subjective:    Patient ID: Kenneth Greene, male    DOB: 10/25/1966, 57 y.o.   MRN: [id number]   Chief Complaint: medical management of chronic issues     HPI:  Kenneth Greene is a 57 y.o. who identifies as a male who was assigned male at birth.   Social history: Lives with: wife Work history: [employer]   Comes in today for follow up of the following chronic medical issues:  1. Primary hypertension No c/o chest pain, sob or headache. Does check blood pressure at home. Usually runs in 120-130 systolic. BP Readings from Last 3 Encounters:  02/01/23 (!) 144/82  08/03/22 (!) 140/81  01/28/22 132/88     2. Depression, recurrent ([facility]) Is currently not on anti depressant. Is currently not doing well. His dad recently passed and he is very stressed at work. Was on zoloft in past and did well.    08/05/2023    8:06 AM 02/01/2023    8:31 AM 08/03/2022    9:10 AM  Depression screen PHQ 2/9  Decreased Interest 2 0 0  Down, Depressed, Hopeless 2 0 0  PHQ - 2 Score 4 0 0  Altered sleeping 3 3 0  Tired, decreased energy 3 3 0  Change in appetite 0 0 0  Feeling bad or failure about yourself  2 0 0  Trouble concentrating 2 0 0  Moving slowly or fidgety/restless 1 0 0  Suicidal thoughts 0 0 0  PHQ-9 Score 15 6 0  Difficult doing work/chores Very difficult Not difficult at all Not difficult at all     3. Primary insomnia Is on remeron and sleeps well.    New complaints: None today  No Known Allergies Outpatient Encounter Medications as of 08/05/2023  Medication Sig   lisinopril (ZESTRIL) 20 MG tablet Take 1 tablet (20 mg total) by mouth daily.   mirtazapine (REMERON SOL-TAB) 30 MG disintegrating tablet TAKE 1 TABLET BY MOUTH AT BEDTIME   No facility-administered encounter medications on file as of 08/05/2023.    Past Surgical History:  Procedure Laterality Date   ORCHIECTOMY N/A 11/08/1981   unilateral   tendon repair in hand  2022    Family History  Problem  Relation Age of Onset   Diabetes Mother    Heart disease Father       Controlled substance contract: n/a     Review of Systems  Constitutional:  Negative for diaphoresis.  Eyes:  Negative for pain.  Respiratory:  Negative for shortness of breath.   Cardiovascular:  Negative for chest pain, palpitations and leg swelling.  Gastrointestinal:  Negative for abdominal pain.  Endocrine: Negative for polydipsia.  Skin:  Negative for rash.  Neurological:  Negative for dizziness, weakness and headaches.  Hematological:  Does not bruise/bleed easily.  All other systems reviewed and are negative.      Objective:   Physical Exam Vitals and nursing note reviewed.  Constitutional:      Appearance: Normal appearance. He is well-developed.  HENT:     Head: Normocephalic.     Nose: Nose normal.     Mouth/Throat:     Mouth: Mucous membranes are moist.     Pharynx: Oropharynx is clear.     Comments: Multiple dental caries Eyes:     Pupils: Pupils are equal, round, and reactive to light.  Neck:     Thyroid: No thyroid mass or thyromegaly.     Vascular: No carotid bruit or JVD.  Trachea: Phonation normal.  Cardiovascular:     Rate and Rhythm: Normal rate and regular rhythm.  Pulmonary:     Effort: Pulmonary effort is normal. No respiratory distress.     Breath sounds: Normal breath sounds.  Abdominal:     General: Bowel sounds are normal.     Palpations: Abdomen is soft.     Tenderness: There is no abdominal tenderness.  Musculoskeletal:        General: Normal range of motion.     Cervical back: Normal range of motion and neck supple.  Lymphadenopathy:     Cervical: No cervical adenopathy.  Skin:    General: Skin is warm and dry.  Neurological:     Mental Status: He is alert and oriented to person, place, and time.  Psychiatric:        Behavior: Behavior normal.        Thought Content: Thought content normal.        Judgment: Judgment normal.    BP (!) 140/82   Pulse  80   Temp 97.9 F (36.6 C) (Temporal)   Resp 20   Ht 5\' 6"  (1.676 m)   Wt 182 lb (82.6 kg)   SpO2 98%   BMI 29.38 kg/m         Assessment & Plan:   AKIEM URIETA comes in today with chief complaint of Medical Management of Chronic Issues   Diagnosis and orders addressed:  1. Primary hypertension Low sodium diet - lisinopril (ZESTRIL) 20 MG tablet; Take 1 tablet (20 mg total) by mouth daily.  Dispense: 90 tablet; Refill: 1 - CBC with Differential/Platelet - CMP14+EGFR - Lipid panel  2. Depression, recurrent ([facility]) Stress management Start back on zoloft - sertraline (ZOLOFT) 100 MG tablet; Take 1 tablet (100 mg total) by mouth daily.  Dispense: 90 tablet; Refill: 1  3. Primary insomnia Bedtime routine - mirtazapine (REMERON) 30 MG tablet; Take 1 tablet (30 mg total) by mouth at bedtime.  Dispense: 90 tablet; Refill: 1  4. Prostate cancer screening Labs pending - PSA   Labs pending Health Maintenance reviewed Diet and exercise encouraged  Follow up plan: 6 months   Mary-Margaret Daphine Deutscher, FNP

## 2023-08-05 NOTE — Patient Instructions (Signed)

## 2023-08-06 LAB — CMP14+EGFR
ALT: 18 IU/L (ref 0–44)
AST: 19 IU/L (ref 0–40)
Albumin: 4.2 g/dL (ref 3.8–4.9)
Alkaline Phosphatase: 111 IU/L (ref 44–121)
BUN/Creatinine Ratio: 13 (ref 9–20)
BUN: 13 mg/dL (ref 6–24)
Bilirubin Total: 0.4 mg/dL (ref 0.0–1.2)
CO2: 20 mmol/L (ref 20–29)
Calcium: 9.5 mg/dL (ref 8.7–10.2)
Chloride: 102 mmol/L (ref 96–106)
Creatinine, Ser: 1.03 mg/dL (ref 0.76–1.27)
Globulin, Total: 2.6 g/dL (ref 1.5–4.5)
Glucose: 86 mg/dL (ref 70–99)
Potassium: 4.6 mmol/L (ref 3.5–5.2)
Sodium: 140 mmol/L (ref 134–144)
Total Protein: 6.8 g/dL (ref 6.0–8.5)
eGFR: 85 mL/min/{1.73_m2} (ref >59)

## 2023-08-06 LAB — CBC WITH DIFFERENTIAL/PLATELET
Basophils Absolute: 0.1 10*3/uL (ref 0.0–0.2)
Basos: 1 %
EOS (ABSOLUTE): 0.1 10*3/uL (ref 0.0–0.4)
Eos: 1 %
Hematocrit: 49.4 % (ref 37.5–51.0)
Hemoglobin: 16.4 g/dL (ref 13.0–17.7)
Immature Grans (Abs): 0 10*3/uL (ref 0.0–0.1)
Immature Granulocytes: 0 %
Lymphocytes Absolute: 3.1 10*3/uL (ref 0.7–3.1)
Lymphs: 32 %
MCH: 30.7 pg (ref 26.6–33.0)
MCHC: 33.2 g/dL (ref 31.5–35.7)
MCV: 93 fL (ref 79–97)
Monocytes Absolute: 0.8 10*3/uL (ref 0.1–0.9)
Monocytes: 8 %
Neutrophils Absolute: 5.6 10*3/uL (ref 1.4–7.0)
Neutrophils: 58 %
Platelets: 300 10*3/uL (ref 150–450)
RBC: 5.34 x10E6/uL (ref 4.14–5.80)
RDW: 13.4 % (ref 11.6–15.4)
WBC: 9.8 10*3/uL (ref 3.4–10.8)

## 2023-08-06 LAB — LIPID PANEL
Chol/HDL Ratio: 5.2 ratio — ABNORMAL HIGH (ref 0.0–5.0)
Cholesterol, Total: 188 mg/dL (ref 100–199)
HDL: 36 mg/dL — ABNORMAL LOW
LDL Chol Calc (NIH): 130 mg/dL — ABNORMAL HIGH (ref 0–99)
Triglycerides: 121 mg/dL (ref 0–149)
VLDL Cholesterol Cal: 22 mg/dL (ref 5–40)

## 2023-08-06 LAB — PSA: Prostate Specific Ag, Serum: 1.7 ng/mL (ref 0.0–4.0)

## 2024-01-30 ENCOUNTER — Ambulatory Visit: Payer: BC Managed Care – PPO | Admitting: Nurse Practitioner

## 2024-01-30 VITALS — BP 131/87 | HR 76 | Temp 97.8°F | Ht 66.0 in | Wt 182.0 lb

## 2024-01-30 DIAGNOSIS — F339 Major depressive disorder, recurrent, unspecified: Secondary | ICD-10-CM

## 2024-01-30 DIAGNOSIS — I10 Essential (primary) hypertension: Secondary | ICD-10-CM | POA: Diagnosis not present

## 2024-01-30 DIAGNOSIS — F5101 Primary insomnia: Secondary | ICD-10-CM | POA: Diagnosis not present

## 2024-01-30 DIAGNOSIS — Z6828 Body mass index (BMI) 28.0-28.9, adult: Secondary | ICD-10-CM

## 2024-01-30 LAB — LIPID PANEL

## 2024-01-30 MED ORDER — SERTRALINE HCL 100 MG PO TABS
100.0000 mg | ORAL_TABLET | Freq: Every day | ORAL | 1 refills | Status: DC
Start: 2024-01-30 — End: 2024-07-31

## 2024-01-30 MED ORDER — LISINOPRIL 20 MG PO TABS
20.0000 mg | ORAL_TABLET | Freq: Every day | ORAL | 1 refills | Status: DC
Start: 2024-01-30 — End: 2024-07-31

## 2024-01-30 MED ORDER — MIRTAZAPINE 30 MG PO TABS
30.0000 mg | ORAL_TABLET | Freq: Every day | ORAL | 1 refills | Status: DC
Start: 2024-01-30 — End: 2024-07-31

## 2024-01-30 NOTE — Progress Notes (Signed)
 Subjective:    Patient ID: Kenneth Greene, male    DOB: 01-12-1966, 58 y.o.   MRN: [id number]   Chief Complaint: medical management of chronic issues     HPI:  Kenneth Greene is a 58 y.o. who identifies as a male who was assigned male at birth.   Social history: Lives with: wife Work history: [employer]   Comes in today for follow up of the following chronic medical issues:  1. Primary hypertension No c/o chest pain, sob or headache. Does check blood pressure at home. Usually runs in 120-130 systolic. BP Readings from Last 3 Encounters:  08/05/23 (!) 140/82  02/01/23 (!) 144/82  08/03/22 (!) 140/81     2. Depression, recurrent ([facility]) Was depressed at last visit following the death of his dad. We put him on zoloft. He is doing much better. Denies medication side effects.    01/30/2024    8:44 AM 08/05/2023    8:06 AM 02/01/2023    8:31 AM  Depression screen PHQ 2/9  Decreased Interest 0 2 0  Down, Depressed, Hopeless 0 2 0  PHQ - 2 Score 0 4 0  Altered sleeping  3 3  Tired, decreased energy  3 3  Change in appetite  0 0  Feeling bad or failure about yourself   2 0  Trouble concentrating  2 0  Moving slowly or fidgety/restless  1 0  Suicidal thoughts  0 0  PHQ-9 Score  15 6  Difficult doing work/chores  Very difficult Not difficult at all      3. Primary insomnia Is on remeron and sleeps well.    New complaints: None today  No Known Allergies Outpatient Encounter Medications as of 01/30/2024  Medication Sig   lisinopril (ZESTRIL) 20 MG tablet Take 1 tablet (20 mg total) by mouth daily.   mirtazapine (REMERON) 30 MG tablet Take 1 tablet (30 mg total) by mouth at bedtime.   sertraline (ZOLOFT) 100 MG tablet Take 1 tablet (100 mg total) by mouth daily.   No facility-administered encounter medications on file as of 01/30/2024.    Past Surgical History:  Procedure Laterality Date   ORCHIECTOMY N/A 11/08/1981   unilateral   tendon repair in hand   2022    Family History  Problem Relation Age of Onset   Diabetes Mother    Heart disease Father       Controlled substance contract: n/a     Review of Systems  Constitutional:  Negative for diaphoresis.  Eyes:  Negative for pain.  Respiratory:  Negative for shortness of breath.   Cardiovascular:  Negative for chest pain, palpitations and leg swelling.  Gastrointestinal:  Negative for abdominal pain.  Endocrine: Negative for polydipsia.  Skin:  Negative for rash.  Neurological:  Negative for dizziness, weakness and headaches.  Hematological:  Does not bruise/bleed easily.  All other systems reviewed and are negative.      Objective:   Physical Exam Vitals and nursing note reviewed.  Constitutional:      Appearance: Normal appearance. He is well-developed.  HENT:     Head: Normocephalic.     Nose: Nose normal.     Mouth/Throat:     Mouth: Mucous membranes are moist.     Pharynx: Oropharynx is clear.     Comments: Multiple dental caries Eyes:     Pupils: Pupils are equal, round, and reactive to light.  Neck:     Thyroid: No thyroid mass or thyromegaly.  Vascular: No carotid bruit or JVD.     Trachea: Phonation normal.  Cardiovascular:     Rate and Rhythm: Normal rate and regular rhythm.  Pulmonary:     Effort: Pulmonary effort is normal. No respiratory distress.     Breath sounds: Normal breath sounds.  Abdominal:     General: Bowel sounds are normal.     Palpations: Abdomen is soft.     Tenderness: There is no abdominal tenderness.  Musculoskeletal:        General: Normal range of motion.     Cervical back: Normal range of motion and neck supple.  Lymphadenopathy:     Cervical: No cervical adenopathy.  Skin:    General: Skin is warm and dry.  Neurological:     Mental Status: He is alert and oriented to person, place, and time.  Psychiatric:        Behavior: Behavior normal.        Thought Content: Thought content normal.        Judgment: Judgment  normal.    BP 131/87   Pulse 76   Temp 97.8 F (36.6 C) (Temporal)   Ht 5\' 6"  (1.676 m)   Wt 182 lb (82.6 kg)   SpO2 93%   BMI 29.38 kg/m          Assessment & Plan:   Kenneth Greene comes in today with chief complaint of medical management of chronic issues     Diagnosis and orders addressed:  1. Primary hypertension Low sodium diet - lisinopril (ZESTRIL) 20 MG tablet; Take 1 tablet (20 mg total) by mouth daily.  Dispense: 90 tablet; Refill: 1 - CBC with Differential/Platelet - CMP14+EGFR - Lipid panel  2. Depression, recurrent ([facility]) Stress management Start back on zoloft - sertraline (ZOLOFT) 100 MG tablet; Take 1 tablet (100 mg total) by mouth daily.  Dispense: 90 tablet; Refill: 1  3. Primary insomnia Bedtime routine - mirtazapine (REMERON) 30 MG tablet; Take 1 tablet (30 mg total) by mouth at bedtime.  Dispense: 90 tablet; Refill: 1   Labs pending Health Maintenance reviewed Diet and exercise encouraged  Follow up plan: 6 months   Kenneth Daphine Deutscher, FNP

## 2024-01-30 NOTE — Patient Instructions (Signed)

## 2024-01-31 LAB — CMP14+EGFR
ALT: 19 IU/L (ref 0–44)
AST: 20 IU/L (ref 0–40)
Albumin: 4.3 g/dL (ref 3.8–4.9)
Alkaline Phosphatase: 113 IU/L (ref 44–121)
BUN/Creatinine Ratio: 15 (ref 9–20)
BUN: 16 mg/dL (ref 6–24)
Bilirubin Total: 0.2 mg/dL (ref 0.0–1.2)
CO2: 21 mmol/L (ref 20–29)
Calcium: 9.6 mg/dL (ref 8.7–10.2)
Chloride: 103 mmol/L (ref 96–106)
Creatinine, Ser: 1.09 mg/dL (ref 0.76–1.27)
Globulin, Total: 2.6 g/dL (ref 1.5–4.5)
Glucose: 92 mg/dL (ref 70–99)
Potassium: 4.5 mmol/L (ref 3.5–5.2)
Sodium: 141 mmol/L (ref 134–144)
Total Protein: 6.9 g/dL (ref 6.0–8.5)
eGFR: 79 mL/min/{1.73_m2} (ref >59)

## 2024-01-31 LAB — LIPID PANEL
Cholesterol, Total: 224 mg/dL — ABNORMAL HIGH (ref 100–199)
HDL: 38 mg/dL — ABNORMAL LOW (ref >39)
LDL CALC COMMENT:: 5.9 ratio — ABNORMAL HIGH (ref 0.0–5.0)
LDL Chol Calc (NIH): 164 mg/dL — ABNORMAL HIGH (ref 0–99)
Triglycerides: 119 mg/dL (ref 0–149)
VLDL Cholesterol Cal: 22 mg/dL (ref 5–40)

## 2024-01-31 LAB — CBC WITH DIFFERENTIAL/PLATELET
Basophils Absolute: 0.1 10*3/uL (ref 0.0–0.2)
Basos: 1 %
EOS (ABSOLUTE): 0.2 10*3/uL (ref 0.0–0.4)
Eos: 2 %
Hematocrit: 50.7 % (ref 37.5–51.0)
Hemoglobin: 16.6 g/dL (ref 13.0–17.7)
Immature Grans (Abs): 0 10*3/uL (ref 0.0–0.1)
Immature Granulocytes: 0 %
Lymphocytes Absolute: 3.3 10*3/uL — ABNORMAL HIGH (ref 0.7–3.1)
Lymphs: 37 %
MCH: 30.7 pg (ref 26.6–33.0)
MCHC: 32.7 g/dL (ref 31.5–35.7)
MCV: 94 fL (ref 79–97)
Monocytes Absolute: 0.9 10*3/uL (ref 0.1–0.9)
Monocytes: 10 %
Neutrophils Absolute: 4.4 10*3/uL (ref 1.4–7.0)
Neutrophils: 50 %
Platelets: 315 10*3/uL (ref 150–450)
RBC: 5.41 x10E6/uL (ref 4.14–5.80)
RDW: 13.3 % (ref 11.6–15.4)
WBC: 8.9 10*3/uL (ref 3.4–10.8)

## 2024-01-31 MED ORDER — ROSUVASTATIN CALCIUM 20 MG PO TABS: 20.0000 mg | ORAL_TABLET | Freq: Every day | ORAL | 1 refills | Status: DC

## 2024-01-31 NOTE — Addendum Note (Signed)
 Addended by: Bennie Pierini on: 01/31/2024 01:12 PM   Modules accepted: Orders

## 2024-07-31 ENCOUNTER — Ambulatory Visit: Admitting: Nurse Practitioner

## 2024-07-31 VITALS — BP 132/78 | HR 86 | Temp 98.2°F | Ht 66.0 in | Wt 195.0 lb

## 2024-07-31 DIAGNOSIS — I10 Essential (primary) hypertension: Secondary | ICD-10-CM | POA: Diagnosis not present

## 2024-07-31 DIAGNOSIS — F5101 Primary insomnia: Secondary | ICD-10-CM | POA: Diagnosis not present

## 2024-07-31 DIAGNOSIS — R1110 Vomiting, unspecified: Secondary | ICD-10-CM | POA: Diagnosis not present

## 2024-07-31 DIAGNOSIS — Z0000 Encounter for general adult medical examination without abnormal findings: Secondary | ICD-10-CM

## 2024-07-31 DIAGNOSIS — Z0001 Encounter for general adult medical examination with abnormal findings: Secondary | ICD-10-CM

## 2024-07-31 DIAGNOSIS — Z6831 Body mass index (BMI) 31.0-31.9, adult: Secondary | ICD-10-CM

## 2024-07-31 DIAGNOSIS — Z23 Encounter for immunization: Secondary | ICD-10-CM

## 2024-07-31 DIAGNOSIS — F339 Major depressive disorder, recurrent, unspecified: Secondary | ICD-10-CM

## 2024-07-31 LAB — LIPID PANEL

## 2024-07-31 MED ORDER — MIRTAZAPINE 30 MG PO TABS: 30.0000 mg | ORAL_TABLET | Freq: Every day | ORAL | 1 refills | Status: AC

## 2024-07-31 MED ORDER — SERTRALINE HCL 100 MG PO TABS: 100.0000 mg | ORAL_TABLET | Freq: Every day | ORAL | 1 refills | Status: AC

## 2024-07-31 MED ORDER — LISINOPRIL 20 MG PO TABS: 20.0000 mg | ORAL_TABLET | Freq: Every day | ORAL | 1 refills | Status: AC

## 2024-07-31 MED ORDER — ROSUVASTATIN CALCIUM 20 MG PO TABS: 20.0000 mg | ORAL_TABLET | Freq: Every day | ORAL | 1 refills | Status: AC

## 2024-07-31 NOTE — Progress Notes (Signed)
 Subjective:    Patient ID: Kenneth Greene, male    DOB: 04/19/1966, 58 y.o.   MRN: [id number]   Chief Complaint: annual physical    HPI:  Kenneth Greene is a 58 y.o. who identifies as a male who was assigned male at birth.   Social history: Lives with: wife Work history: [employer]   Comes in today for follow up of the following chronic medical issues:  1. Primary hypertension No c/o chest pain, sob or headache. Does check blood pressure at home. Usually runs in 120-130 systolic. BP Readings from Last 3 Encounters:  01/30/24 131/87  08/05/23 (!) 140/82  02/01/23 (!) 144/82     2. Depression, recurrent ([facility]) Was depressed at last visit following the death of his dad. We put him on zoloft . He is doing much better. Denies medication side effects.    07/31/2024    7:58 AM 01/30/2024    8:44 AM 08/05/2023    8:06 AM  Depression screen PHQ 2/9  Decreased Interest 0 0 2  Down, Depressed, Hopeless 0 0 2  PHQ - 2 Score 0 0 4  Altered sleeping 0  3  Tired, decreased energy 0  3  Change in appetite 0  0  Feeling bad or failure about yourself  0  2  Trouble concentrating 0  2  Moving slowly or fidgety/restless 0  1  Suicidal thoughts 0  0  PHQ-9 Score 0  15  Difficult doing work/chores Not difficult at all  Very difficult       3. Primary insomnia Is on remeron  and sleeps well.   4. Bmi 28.0-28.9 Weight is up13lbs Wt Readings from Last 3 Encounters:  07/31/24 195 lb (88.5 kg)  01/30/24 182 lb (82.6 kg)  08/05/23 182 lb (82.6 kg)   BMI Readings from Last 3 Encounters:  07/31/24 31.47 kg/m  01/30/24 29.38 kg/m  08/05/23 29.38 kg/m     New complaints: Has dry heaves a couple of times a week and gags until he throws up.  No Known Allergies Outpatient Encounter Medications as of 07/31/2024  Medication Sig   lisinopril  (ZESTRIL ) 20 MG tablet Take 1 tablet (20 mg total) by mouth daily.   mirtazapine  (REMERON ) 30 MG tablet Take 1 tablet (30 mg total) by  mouth at bedtime.   rosuvastatin  (CRESTOR ) 20 MG tablet Take 1 tablet (20 mg total) by mouth daily.   sertraline  (ZOLOFT ) 100 MG tablet Take 1 tablet (100 mg total) by mouth daily.   No facility-administered encounter medications on file as of 07/31/2024.    Past Surgical History:  Procedure Laterality Date   ORCHIECTOMY N/A 11/08/1981   unilateral   tendon repair in hand  2022    Family History  Problem Relation Age of Onset   Diabetes Mother    Heart disease Father       Controlled substance contract: n/a     Review of Systems  Constitutional:  Negative for diaphoresis.  Eyes:  Negative for pain.  Respiratory:  Negative for shortness of breath.   Cardiovascular:  Negative for chest pain, palpitations and leg swelling.  Gastrointestinal:  Negative for abdominal pain.  Endocrine: Negative for polydipsia.  Skin:  Negative for rash.  Neurological:  Negative for dizziness, weakness and headaches.  Hematological:  Does not bruise/bleed easily.  All other systems reviewed and are negative.      Objective:   Physical Exam Vitals and nursing note reviewed.  Constitutional:  Appearance: Normal appearance. He is well-developed.  HENT:     Head: Normocephalic.     Nose: Nose normal.     Mouth/Throat:     Mouth: Mucous membranes are moist.     Pharynx: Oropharynx is clear.     Comments: Multiple dental caries Eyes:     Pupils: Pupils are equal, round, and reactive to light.  Neck:     Thyroid: No thyroid mass or thyromegaly.     Vascular: No carotid bruit or JVD.     Trachea: Phonation normal.  Cardiovascular:     Rate and Rhythm: Normal rate and regular rhythm.  Pulmonary:     Effort: Pulmonary effort is normal. No respiratory distress.     Breath sounds: Normal breath sounds.  Abdominal:     General: Bowel sounds are normal.     Palpations: Abdomen is soft.     Tenderness: There is no abdominal tenderness.  Musculoskeletal:        General: Normal range  of motion.     Cervical back: Normal range of motion and neck supple.  Lymphadenopathy:     Cervical: No cervical adenopathy.  Skin:    General: Skin is warm and dry.  Neurological:     Mental Status: He is alert and oriented to person, place, and time.  Psychiatric:        Behavior: Behavior normal.        Thought Content: Thought content normal.        Judgment: Judgment normal.    BP 132/78   Pulse 86   Temp 98.2 F (36.8 C) (Temporal)   Ht 5' 6 (1.676 m)   Wt 195 lb (88.5 kg)   SpO2 95%   BMI 31.47 kg/m            Assessment & Plan:  ARA MANO comes in today with chief complaint of Medical Management of Chronic Issues   Diagnosis and orders addressed:  1. Annual physical exam ([facility]) Labs pending  2. Primary hypertension Low salt diet - lisinopril  (ZESTRIL ) 20 MG tablet; Take 1 tablet (20 mg total) by mouth daily.  Dispense: 90 tablet; Refill: 1  3. Depression, recurrent Stress management - sertraline  (ZOLOFT ) 100 MG tablet; Take 1 tablet (100 mg total) by mouth daily.  Dispense: 90 tablet; Refill: 1  4. Primary insomnia Bedtime routine - mirtazapine  (REMERON ) 30 MG tablet; Take 1 tablet (30 mg total) by mouth at bedtime.  Dispense: 90 tablet; Refill: 1  5. BMI 31.0-31.9,adult Discussed diet and exercise for person with BMI >25 Will recheck weight in 3-6 months   6. Dry heaves Avoid spicy and fatty foods Needs colonoscopy so will just go ahead and do GI referral - Ambulatory referral to Gastroenterology   Labs pending Health Maintenance reviewed Diet and exercise encouraged  Follow up plan: 6 months   Mary-Margaret Gladis, FNP

## 2024-07-31 NOTE — Patient Instructions (Signed)

## 2024-07-31 NOTE — Addendum Note (Signed)
 Addended by: GLADIS MUSTARD on: 07/31/2024 08:23 AM   Modules accepted: Orders, Level of Service

## 2024-08-01 LAB — CMP14+EGFR
ALT: 22 IU/L (ref 0–44)
AST: 18 IU/L (ref 0–40)
Albumin: 4.4 g/dL (ref 3.8–4.9)
Alkaline Phosphatase: 105 IU/L (ref 47–123)
BUN/Creatinine Ratio: 13 (ref 9–20)
BUN: 15 mg/dL (ref 6–24)
Bilirubin Total: 0.2 mg/dL (ref 0.0–1.2)
CO2: 22 mmol/L (ref 20–29)
Calcium: 9.4 mg/dL (ref 8.7–10.2)
Chloride: 101 mmol/L (ref 96–106)
Creatinine, Ser: 1.14 mg/dL (ref 0.76–1.27)
Globulin, Total: 2.1 g/dL (ref 1.5–4.5)
Glucose: 94 mg/dL (ref 70–99)
Potassium: 4.3 mmol/L (ref 3.5–5.2)
Sodium: 139 mmol/L (ref 134–144)
Total Protein: 6.5 g/dL (ref 6.0–8.5)
eGFR: 75 mL/min/1.73 (ref >59)

## 2024-08-01 LAB — CBC WITH DIFFERENTIAL/PLATELET
Basophils Absolute: 0.2 x10E3/uL (ref 0.0–0.2)
Basos: 1 %
EOS (ABSOLUTE): 0.3 x10E3/uL (ref 0.0–0.4)
Eos: 2 %
Hematocrit: 48.9 % (ref 37.5–51.0)
Hemoglobin: 15.9 g/dL (ref 13.0–17.7)
Immature Grans (Abs): 0 x10E3/uL (ref 0.0–0.1)
Immature Granulocytes: 0 %
Lymphocytes Absolute: 4.6 x10E3/uL — ABNORMAL HIGH (ref 0.7–3.1)
Lymphs: 33 %
MCH: 30.9 pg (ref 26.6–33.0)
MCHC: 32.5 g/dL (ref 31.5–35.7)
MCV: 95 fL (ref 79–97)
Monocytes Absolute: 1.2 x10E3/uL — ABNORMAL HIGH (ref 0.1–0.9)
Monocytes: 9 %
Neutrophils Absolute: 7.7 x10E3/uL — ABNORMAL HIGH (ref 1.4–7.0)
Neutrophils: 55 %
Platelets: 257 x10E3/uL (ref 150–450)
RBC: 5.15 x10E6/uL (ref 4.14–5.80)
RDW: 13.1 % (ref 11.6–15.4)
WBC: 13.9 x10E3/uL — ABNORMAL HIGH (ref 3.4–10.8)

## 2024-08-01 LAB — LIPID PANEL
Cholesterol, Total: 178 mg/dL (ref 100–199)
HDL: 37 mg/dL — AB (ref >39)
LDL CALC COMMENT:: 4.8 ratio (ref 0.0–5.0)
LDL Chol Calc (NIH): 119 mg/dL — AB (ref 0–99)
Triglycerides: 121 mg/dL (ref 0–149)
VLDL Cholesterol Cal: 22 mg/dL (ref 5–40)

## 2024-08-02 ENCOUNTER — Ambulatory Visit: Payer: Self-pay | Admitting: Nurse Practitioner

## 2025-01-28 ENCOUNTER — Ambulatory Visit: Payer: Self-pay | Admitting: Nurse Practitioner
